# Patient Record
Sex: Male | Born: 1965 | Race: Black or African American | Hispanic: No | Marital: Married | Smoking: Never smoker
Health system: Southern US, Community
[De-identification: ages and names within clinical notes are randomized; demographics above are authoritative.]

## PROBLEM LIST (undated history)

## (undated) DIAGNOSIS — N419 Inflammatory disease of prostate, unspecified: Secondary | ICD-10-CM

## (undated) DIAGNOSIS — K921 Melena: Secondary | ICD-10-CM

## (undated) HISTORY — DX: Inflammatory disease of prostate, unspecified: N41.9

## (undated) HISTORY — PX: VASECTOMY: SHX75

## (undated) HISTORY — DX: Melena: K92.1

---

## 1992-11-06 HISTORY — PX: CERVICAL SPINE SURGERY: SHX589

## 2001-01-04 ENCOUNTER — Other Ambulatory Visit: Admission: RE | Admit: 2001-01-04 | Discharge: 2001-01-04 | Payer: Self-pay | Admitting: Urology

## 2001-09-26 ENCOUNTER — Encounter: Payer: Self-pay | Admitting: Emergency Medicine

## 2001-09-26 ENCOUNTER — Emergency Department (HOSPITAL_COMMUNITY): Admission: EM | Admit: 2001-09-26 | Discharge: 2001-09-26 | Payer: Self-pay | Admitting: Emergency Medicine

## 2001-11-08 ENCOUNTER — Encounter: Payer: Self-pay | Admitting: *Deleted

## 2001-11-08 ENCOUNTER — Ambulatory Visit (HOSPITAL_COMMUNITY): Admission: RE | Admit: 2001-11-08 | Discharge: 2001-11-08 | Payer: Self-pay | Admitting: *Deleted

## 2001-12-11 ENCOUNTER — Encounter: Payer: Self-pay | Admitting: Neurosurgery

## 2001-12-11 ENCOUNTER — Encounter: Admission: RE | Admit: 2001-12-11 | Discharge: 2001-12-11 | Payer: Self-pay | Admitting: Neurosurgery

## 2001-12-13 ENCOUNTER — Encounter: Admission: RE | Admit: 2001-12-13 | Discharge: 2001-12-13 | Payer: Self-pay | Admitting: Neurosurgery

## 2001-12-13 ENCOUNTER — Encounter: Payer: Self-pay | Admitting: Neurosurgery

## 2002-04-25 ENCOUNTER — Encounter: Admission: RE | Admit: 2002-04-25 | Discharge: 2002-07-24 | Payer: Self-pay

## 2002-08-18 ENCOUNTER — Encounter: Admission: RE | Admit: 2002-08-18 | Discharge: 2002-11-16 | Payer: Self-pay | Admitting: Anesthesiology

## 2002-11-27 ENCOUNTER — Encounter: Admission: RE | Admit: 2002-11-27 | Discharge: 2003-02-25 | Payer: Self-pay

## 2003-05-21 ENCOUNTER — Encounter
Admission: RE | Admit: 2003-05-21 | Discharge: 2003-08-19 | Payer: Self-pay | Admitting: Physical Medicine & Rehabilitation

## 2003-08-07 ENCOUNTER — Encounter: Payer: Self-pay | Admitting: Neurosurgery

## 2003-08-07 ENCOUNTER — Ambulatory Visit (HOSPITAL_COMMUNITY): Admission: RE | Admit: 2003-08-07 | Discharge: 2003-08-07 | Payer: Self-pay | Admitting: Neurosurgery

## 2003-09-30 ENCOUNTER — Ambulatory Visit (HOSPITAL_COMMUNITY): Admission: RE | Admit: 2003-09-30 | Discharge: 2003-10-01 | Payer: Self-pay | Admitting: Neurosurgery

## 2008-11-06 DIAGNOSIS — K921 Melena: Secondary | ICD-10-CM

## 2008-11-06 HISTORY — DX: Melena: K92.1

## 2010-01-03 LAB — CBC AND DIFFERENTIAL
HCT: 44 % (ref 41–53)
Hemoglobin: 14.8 g/dL (ref 13.5–17.5)
Neutrophils Absolute: 3 /uL
Platelets: 176 10*3/uL (ref 150–399)

## 2010-01-03 LAB — LIPID PANEL
Cholesterol: 164 mg/dL (ref 0–200)
HDL: 52 mg/dL (ref 35–70)
LDL Cholesterol: 102 mg/dL
Triglycerides: 51 mg/dL (ref 40–160)

## 2011-04-10 LAB — BASIC METABOLIC PANEL: Creatinine: 1.1 mg/dL (ref <=1.3)

## 2011-04-10 LAB — HEPATIC FUNCTION PANEL: Alkaline Phosphatase: 57 U/L (ref 25–125)

## 2011-06-12 ENCOUNTER — Encounter: Payer: Self-pay | Admitting: Gastroenterology

## 2012-11-04 ENCOUNTER — Encounter: Payer: Self-pay | Admitting: Physician Assistant

## 2012-11-04 ENCOUNTER — Ambulatory Visit (INDEPENDENT_AMBULATORY_CARE_PROVIDER_SITE_OTHER): Payer: BC Managed Care – PPO | Admitting: Physician Assistant

## 2012-11-04 VITALS — BP 100/67 | HR 58 | Temp 97.4°F | Resp 16 | Ht 67.5 in | Wt 134.0 lb

## 2012-11-04 DIAGNOSIS — Z0000 Encounter for general adult medical examination without abnormal findings: Secondary | ICD-10-CM

## 2012-11-04 LAB — TSH: TSH: 1.633 u[IU]/mL (ref 0.350–4.500)

## 2012-11-04 LAB — COMPREHENSIVE METABOLIC PANEL
ALT: 12 U/L (ref 0–53)
Albumin: 4.6 g/dL (ref 3.5–5.2)
CO2: 31 mEq/L (ref 19–32)
Calcium: 9.4 mg/dL (ref 8.4–10.5)
Chloride: 100 mEq/L (ref 96–112)
Glucose, Bld: 88 mg/dL (ref 70–99)
Sodium: 139 mEq/L (ref 135–145)
Total Protein: 6.8 g/dL (ref 6.0–8.3)

## 2012-11-04 LAB — POCT UA - MICROSCOPIC ONLY
Bacteria, U Microscopic: NEGATIVE
Casts, Ur, LPF, POC: NEGATIVE
Crystals, Ur, HPF, POC: NEGATIVE
Epithelial cells, urine per micros: NEGATIVE
Mucus, UA: NEGATIVE
RBC, urine, microscopic: NEGATIVE
WBC, Ur, HPF, POC: NEGATIVE
Yeast, UA: NEGATIVE

## 2012-11-04 LAB — LIPID PANEL
Cholesterol: 154 mg/dL (ref 0–200)
Triglycerides: 73 mg/dL (ref <150)

## 2012-11-04 LAB — CBC
Hemoglobin: 14.8 g/dL (ref 13.0–17.0)
MCV: 83.9 fL (ref 78.0–100.0)
Platelets: 179 10*3/uL (ref 150–400)
RBC: 4.97 MIL/uL (ref 4.22–5.81)
WBC: 4.6 10*3/uL (ref 4.0–10.5)

## 2012-11-04 LAB — POCT URINALYSIS DIPSTICK
Bilirubin, UA: NEGATIVE
Blood, UA: NEGATIVE
Glucose, UA: NEGATIVE
Ketones, UA: NEGATIVE
Leukocytes, UA: NEGATIVE
Nitrite, UA: NEGATIVE
pH, UA: 6

## 2012-11-04 NOTE — Progress Notes (Signed)
Patient ID: John Mercer MRN: [id number], DOB: 08-23-1966 46 y.o. Date of Encounter: 11/04/2012, 6:29 PM  Primary Physician: No primary provider on file.  Chief Complaint: Physical (CPE)  HPI: 46 y.o. y/o male with history noted below here for CPE. Doing well. No issues/complaints. Last CPE was 04/10/2011. Overall doing well. 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 Eats a healthy diet and gets a fair amount of exercise with his job. Has not received a flu vaccine this year and does not plan on receiving one.    Review of Systems: Consitutional: No fever, chills, fatigue, night sweats, lymphadenopathy, or weight changes. Eyes: No visual changes, eye redness, or discharge. ENT/Mouth: Ears: No otalgia, tinnitus, hearing loss, discharge. Nose: No congestion, rhinorrhea, sinus pain, or epistaxis. Throat: No sore throat, post nasal drip, or teeth pain. Cardiovascular: No CP, palpitations, diaphoresis, DOE, edema, orthopnea, PND. Respiratory: No cough, hemoptysis, SOB, or wheezing. Gastrointestinal: No anorexia, dysphagia, reflux, pain, nausea, vomiting, hematemesis, diarrhea, constipation, BRBPR, or melena. Genitourinary: No dysuria, frequency, urgency, hematuria, incontinence, nocturia, decreased urinary stream, discharge, impotence, or testicular pain/masses. Musculoskeletal: No decreased ROM, myalgias, stiffness, joint swelling, or weakness. Skin: No rash, erythema, lesion changes, pain, warmth, jaundice, or pruritis. Neurological: No headache, dizziness, syncope, seizures, tremors, memory loss, coordination problems, or paresthesias. Psychological: No anxiety, depression, hallucinations, SI/HI. Endocrine: No fatigue, polydipsia, polyphagia, polyuria, or known diabetes. All other systems were reviewed and are otherwise negative.  Past Medical History  Diagnosis Date  . Prostatitis   . Hematochezia 2010     Past  Surgical History  Procedure Date  . Vasectomy   . Cervical spine surgery 1994    Home Meds:  Prior to Admission medications   Not on File    Allergies: Not on File  History   Social History  . Marital Status: Married    Spouse Name: N/A    Number of Children: N/A  . Years of Education: N/A   Occupational History  . Not on file.   Social History Main Topics  . Smoking status: Never Smoker   . Smokeless tobacco: Never Used  . Alcohol Use: No  . Drug Use: No  . Sexually Active: Yes   Other Topics Concern  . Not on file   Social History Narrative  . No narrative on file    Family History  Problem Relation Age of Onset  . Hypertension Mother     Physical Exam: Blood pressure 100/67, pulse 58, temperature 97.4 F (36.3 C), temperature source Oral, resp. rate 16, height 5' 7.5" (1.715 m), weight 134 lb (60.782 kg).  General: Well developed, well nourished, in no acute distress. HEENT: Normocephalic, atraumatic. Conjunctiva pink, sclera non-icteric. Pupils 2 mm constricting to 1 mm, round, regular, and equally reactive to light and accomodation. EOMI. Internal auditory canal clear. TMs with good cone of light and without pathology. Nasal mucosa pink. Nares are without discharge. No sinus tenderness. Oral mucosa pink. Dentition normal. Pharynx without exudate.   Neck: Supple. Trachea midline. No thyromegaly. Full ROM. No lymphadenopathy. Lungs: Clear to auscultation bilaterally without wheezes, rales, or rhonchi. Breathing is of normal effort and unlabored. Cardiovascular: RRR with S1 S2. No murmurs, rubs, or gallops appreciated. Distal pulses 2+ symmetrically. No carotid or abdominal bruits. Abdomen: Soft, non-tender, non-distended with normoactive bowel sounds. No hepatosplenomegaly or masses. No rebound/guarding. No CVA tenderness. Without hernias.  Rectal: No external hemorrhoids or  fissures. Rectal vault without masses. Prostate normal range.   Genitourinary:  Circumcised male. No penile lesions. Testes descended bilaterally, and smooth without tenderness or masses.  Musculoskeletal: Full range of motion and 5/5 strength throughout. Without swelling, atrophy, tenderness, crepitus, or warmth. Extremities without clubbing, cyanosis, or edema. Calves supple. Skin: Warm and moist without erythema, ecchymosis, wounds, or rash. Neuro: A+Ox3. CN II-XII grossly intact. Moves all extremities spontaneously. Full sensation throughout. Normal gait. DTR 2+ throughout upper and lower extremities. Finger to nose intact. Psych:  Responds to questions appropriately with a normal affect.   Studies:  Results for orders placed in visit on 11/04/12  POCT UA - MICROSCOPIC ONLY      Component Value Range   WBC, Ur, HPF, POC neg     RBC, urine, microscopic neg     Bacteria, U Microscopic neg     Mucus, UA neg     Epithelial cells, urine per micros neg     Crystals, Ur, HPF, POC neg     Casts, Ur, LPF, POC neg     Yeast, UA neg    POCT URINALYSIS DIPSTICK      Component Value Range   Color, UA light yellow     Clarity, UA clear     Glucose, UA neg     Bilirubin, UA neg     Ketones, UA neg     Spec Grav, UA <=1.005     Blood, UA neg     pH, UA 6.0     Protein, UA neg     Urobilinogen, UA 0.2     Nitrite, UA neg     Leukocytes, UA Negative    IFOBT (OCCULT BLOOD)      Component Value Range   IFOBT Negative      Please see abstracted labs from 04/10/11 and 01/03/10. PSA from 04/10/11 is 0.95.   CBC, CMET, Lipid, PSA, TSH all pending. Patient is not fasting.   Assessment/Plan:  46 y.o. y/o [place] American male here for CPE -Healthy adult male physical -He declines influenza vaccine today, precautions given -Healthy diet -Exercise -Weight loss -He will need routine screening colonoscopy at age 46, this was discussed with him today, he understands and agrees -Anticipatory guidance  Signed, Eula Listen, PA-C 11/04/2012 6:29 PM

## 2012-12-25 ENCOUNTER — Other Ambulatory Visit: Payer: Self-pay | Admitting: Occupational Medicine

## 2012-12-25 ENCOUNTER — Ambulatory Visit: Payer: Self-pay

## 2012-12-25 DIAGNOSIS — R52 Pain, unspecified: Secondary | ICD-10-CM

## 2013-08-10 IMAGING — CR DG CLAVICLE*L*
2 series · 2 of 2 positions shown · non-contrast
Comparison: None.

CLINICAL DATA: Injured left clavicle with pain and bruising

LEFT CLAVICLE - 2+ VIEWS

[view not recorded (1 of 2)]
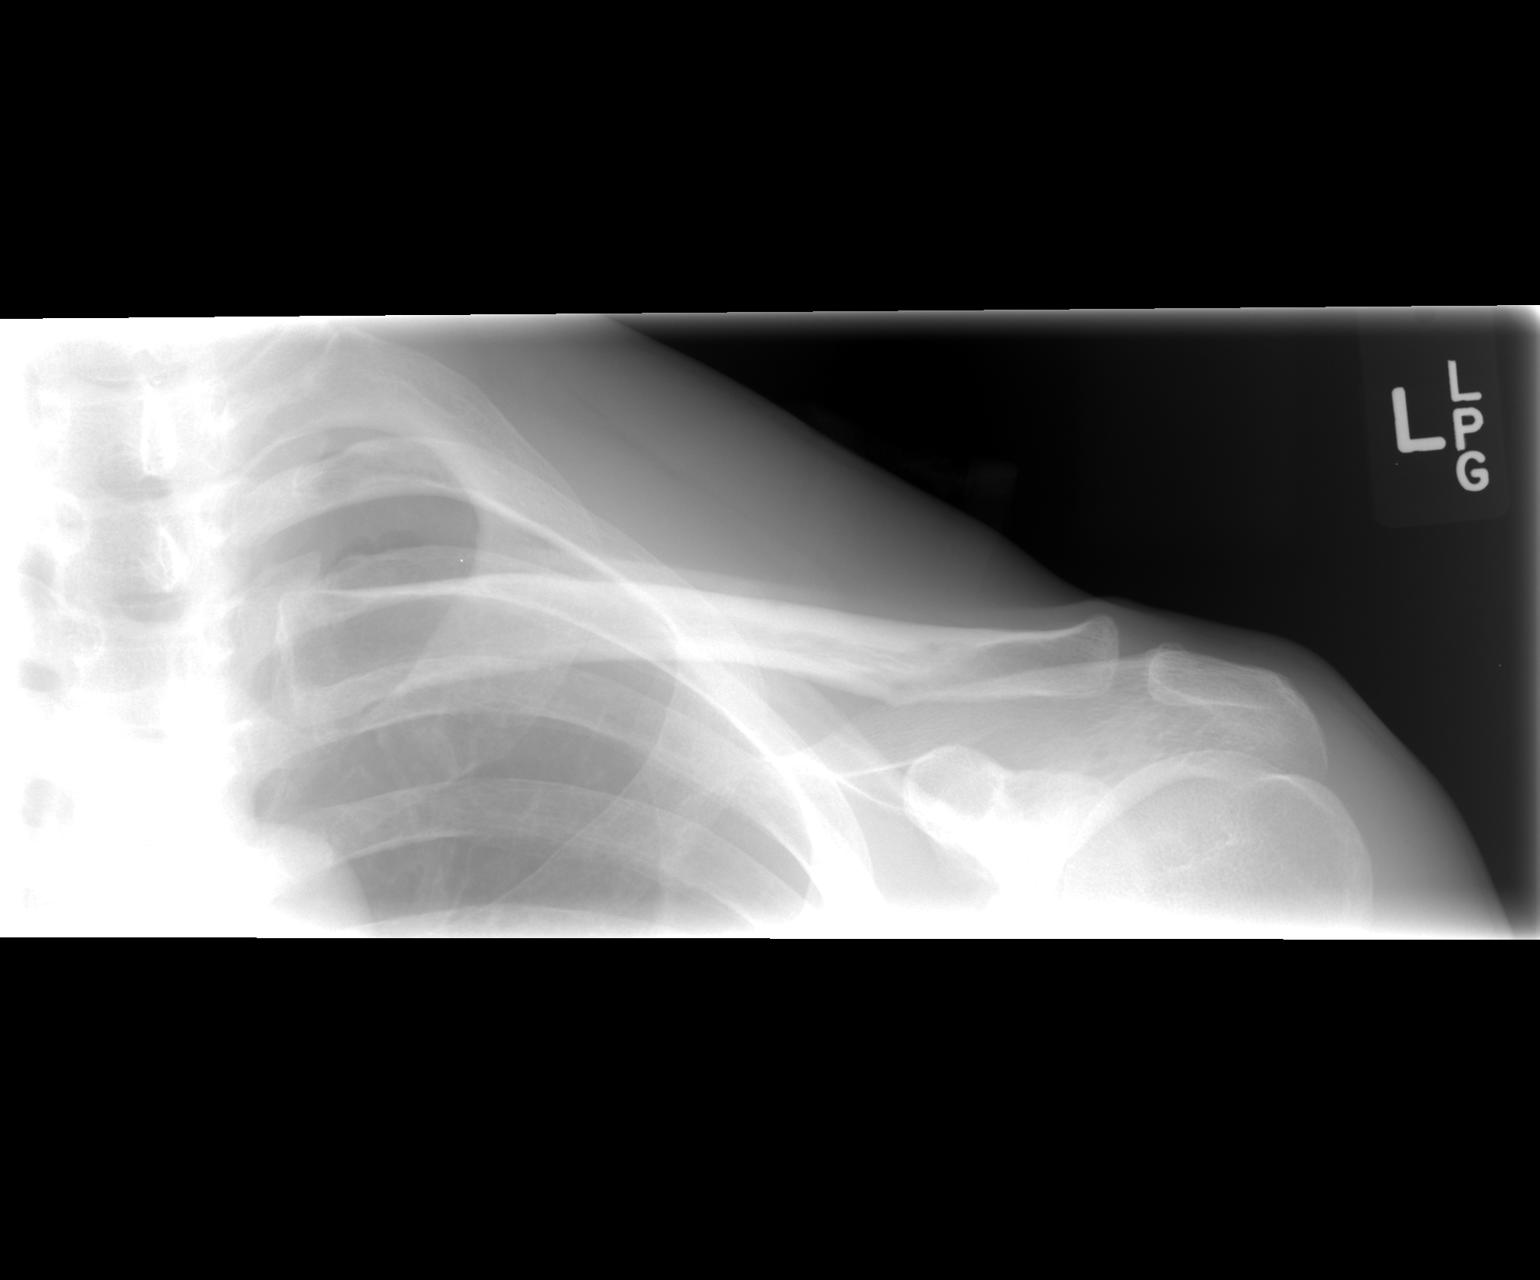

[view not recorded (2 of 2)]
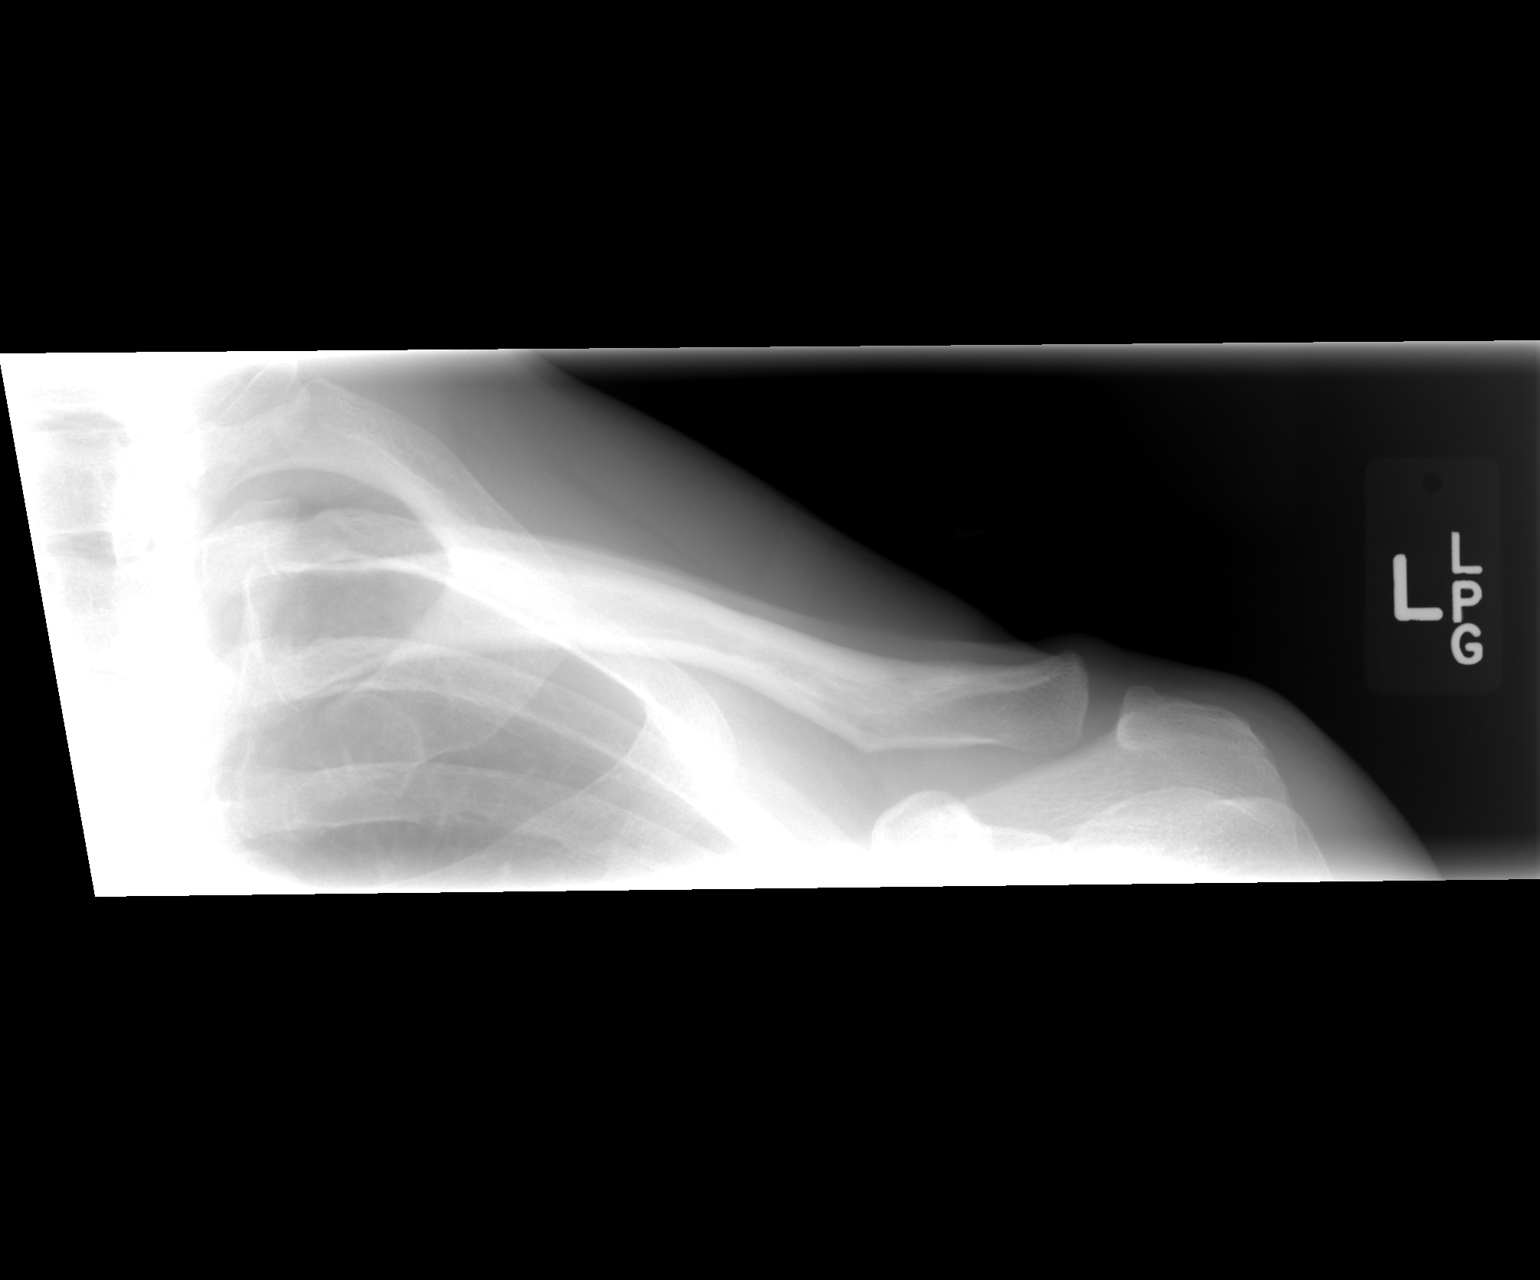

[2 of 2 positions shown; findings below may reference images not displayed]

FINDINGS: The left AC joint is normally aligned and the left
clavicle appears intact.  No fracture is seen.  The left humeral
head appears to be normally positioned.
IMPRESSION: Negative left clavicle.

## 2013-08-22 ENCOUNTER — Other Ambulatory Visit: Payer: Self-pay | Admitting: Physician Assistant

## 2013-08-27 ENCOUNTER — Other Ambulatory Visit: Payer: Self-pay

## 2013-08-27 NOTE — Telephone Encounter (Signed)
Ryan did the physical / please advise on the request for Valtrex, pended

## 2013-08-27 NOTE — Telephone Encounter (Signed)
Patient calling to get a refill on his rx valtrex. He was told by pharmacy to make an office visit.  Was seen last year for a cpe 2013- and is a patient of Dr. Perrin Maltese.  Says that it is too early for him to come in for his cpe and wants to know why we can not refill it? He requests to have it filled and says he will come in December for an office visit.   Pharmacy- [facility] off [address]- patient unsure which one-  please contact patient first.   [phone]

## 2013-09-02 NOTE — Telephone Encounter (Signed)
I don't see a Rx written in EPIC for this and no discussion at 10/2012 OV, HSV not on problem list. Alycia Rossetti, please advise.

## 2013-09-02 NOTE — Telephone Encounter (Signed)
PT STATES HE ISN'T DUE TO COME BACK IN UNTIL December AND WAS TOLD BY HIS PHARMACY THE MEDICINE WAS DENIED SINCE HE NEEDED AN OV PLEASE CALL PT AT [phone]   [facility] [address]

## 2013-09-05 MED ORDER — VALACYCLOVIR HCL 1 G PO TABS: 1000.0000 mg | ORAL_TABLET | Freq: Two times a day (BID) | ORAL | Status: DC

## 2013-09-05 NOTE — Telephone Encounter (Signed)
Discussed this RF w/Heather because it had still not been reviewed. For some reason it was not showing up on the Rx refill list on PA's computer, but is on mine. Heather reviewed and OKd refill through Dec. Notified pt done and apologized that the message was not reviewed sooner due to computer glitch. Pt voiced understanding and thanked [place].

## 2014-02-02 ENCOUNTER — Ambulatory Visit (INDEPENDENT_AMBULATORY_CARE_PROVIDER_SITE_OTHER): Payer: BC Managed Care – PPO | Admitting: Physician Assistant

## 2014-02-02 ENCOUNTER — Encounter: Payer: Self-pay | Admitting: Physician Assistant

## 2014-02-02 VITALS — BP 118/76 | HR 51 | Temp 97.5°F | Resp 16 | Ht 67.5 in | Wt 137.0 lb

## 2014-02-02 DIAGNOSIS — Z0000 Encounter for general adult medical examination without abnormal findings: Secondary | ICD-10-CM

## 2014-02-02 LAB — POCT URINALYSIS DIPSTICK
Bilirubin, UA: NEGATIVE
GLUCOSE UA: NEGATIVE
Ketones, UA: NEGATIVE
Leukocytes, UA: NEGATIVE
NITRITE UA: NEGATIVE
Protein, UA: NEGATIVE
RBC UA: NEGATIVE
Spec Grav, UA: <=1.005
UROBILINOGEN UA: 0.2
pH, UA: 7

## 2014-02-02 LAB — COMPLETE METABOLIC PANEL WITH GFR
ALK PHOS: 48 U/L (ref 39–117)
ALT: 11 U/L (ref 0–53)
AST: 18 U/L (ref 0–37)
Albumin: 4.5 g/dL (ref 3.5–5.2)
BILIRUBIN TOTAL: 0.9 mg/dL (ref 0.2–1.2)
BUN: 9 mg/dL (ref 6–23)
CALCIUM: 9.2 mg/dL (ref 8.4–10.5)
CHLORIDE: 100 meq/L (ref 96–112)
CO2: 31 mEq/L (ref 19–32)
CREATININE: 1.2 mg/dL (ref 0.50–1.35)
GFR, Est African American: 81 mL/min
GFR, Est Non African American: 70 mL/min
Glucose, Bld: 82 mg/dL (ref 70–99)
Potassium: 3.6 mEq/L (ref 3.5–5.3)
Sodium: 138 mEq/L (ref 135–145)
Total Protein: 7 g/dL (ref 6.0–8.3)

## 2014-02-02 LAB — POCT UA - MICROSCOPIC ONLY
CASTS, UR, LPF, POC: NEGATIVE
Crystals, Ur, HPF, POC: NEGATIVE
Mucus, UA: NEGATIVE
WBC, Ur, HPF, POC: NEGATIVE
YEAST UA: NEGATIVE

## 2014-02-02 LAB — CBC
HCT: 40.9 % (ref 39.0–52.0)
Hemoglobin: 14.4 g/dL (ref 13.0–17.0)
MCH: 29 pg (ref 26.0–34.0)
MCHC: 35.2 g/dL (ref 30.0–36.0)
MCV: 82.3 fL (ref 78.0–100.0)
PLATELETS: 192 10*3/uL (ref 150–400)
RBC: 4.97 MIL/uL (ref 4.22–5.81)
RDW: 14.3 % (ref 11.5–15.5)
WBC: 4.5 10*3/uL (ref 4.0–10.5)

## 2014-02-02 LAB — IFOBT (OCCULT BLOOD): IMMUNOLOGICAL FECAL OCCULT BLOOD TEST: NEGATIVE

## 2014-02-02 NOTE — Progress Notes (Signed)
Patient ID: John Mercer MRN: [id number], DOB: 1966/09/24 48 y.o. Date of Encounter: 02/02/2014, 9:01 PM  Primary Physician: No primary provider on file.  Chief Complaint: Physical (CPE)  HPI: 48 y.o. male with history noted below here for CPE. Doing well. No issues or complaints. Last physical was 11/04/12. Prior patient of Dr. Perrin Maltese, now establishing with me. Eats a healthy diet and gets a fair amount of exercise. Needs a colonoscopy. No alcohol, tobacco, or drug use.   Review of Systems: Consitutional: No fever, chills, fatigue, night sweats, lymphadenopathy, or weight changes. Eyes: No visual changes, eye redness, or discharge. ENT/Mouth: Ears: No otalgia, tinnitus, hearing loss, discharge. Nose: No congestion, rhinorrhea, sinus pain, or epistaxis. Throat: No sore throat, post nasal drip, or teeth pain. Cardiovascular: No CP, palpitations, diaphoresis, DOE, edema, orthopnea, PND. Respiratory: No cough, hemoptysis, SOB, or wheezing. Gastrointestinal: No anorexia, dysphagia, reflux, pain, nausea, vomiting, hematemesis, diarrhea, constipation, BRBPR, or melena. Genitourinary: No dysuria, frequency, urgency, hematuria, incontinence, nocturia, decreased urinary stream, discharge, impotence, or testicular pain/masses. Musculoskeletal: No decreased ROM, myalgias, stiffness, joint swelling, or weakness. Skin: No rash, erythema, lesion changes, pain, warmth, jaundice, or pruritis. Neurological: No headache, dizziness, syncope, seizures, tremors, memory loss, coordination problems, or paresthesias. Psychological: No anxiety, depression, hallucinations, SI/HI. Endocrine: No fatigue, polydipsia, polyphagia, polyuria, or known diabetes.   Past Medical History  Diagnosis Date  . Prostatitis   . Hematochezia 2010     Past Surgical History  Procedure Laterality Date  . Vasectomy    . Cervical spine surgery  1994    Home Meds:  Prior to Admission medications   Medication Sig Start Date  End Date Taking? Authorizing Provider  valACYclovir (VALTREX) 1000 MG tablet Take 1 tablet (1,000 mg total) by mouth 2 (two) times daily. 08/27/13  No Nelva Nay, PA-C    Allergies: No Known Allergies  History   Social History  . Marital Status: Married    Spouse Name: N/A    Number of Children: N/A  . Years of Education: N/A   Occupational History  . Not on file.   Social History Main Topics  . Smoking status: Never Smoker   . Smokeless tobacco: Never Used  . Alcohol Use: No  . Drug Use: No  . Sexual Activity: Yes   Other Topics Concern  . Not on file   Social History Narrative  . No narrative on file    Family History  Problem Relation Age of Onset  . Hypertension Mother     Physical Exam: Blood pressure 118/76, pulse 51, temperature 97.5 F (36.4 C), temperature source Oral, resp. rate 16, height 5' 7.5" (1.715 m), weight 137 lb (62.143 kg), SpO2 100.00%.  General: Well developed, well nourished, in no acute distress. HEENT: Normocephalic, atraumatic. Conjunctiva pink, sclera non-icteric. Pupils 2 mm constricting to 1 mm, round, regular, and equally reactive to light and accomodation. EOMI. Internal auditory canal clear. TMs with good cone of light and without pathology. Nasal mucosa pink. Nares are without discharge. No sinus tenderness. Oral mucosa pink. Dentition normal. Pharynx without exudate.   Neck: Supple. Trachea midline. No thyromegaly. Full ROM. No lymphadenopathy. Lungs: Clear to auscultation bilaterally without wheezes, rales, or rhonchi. Breathing is of normal effort and unlabored. Cardiovascular: RRR with S1 S2. No murmurs, rubs, or gallops appreciated. Distal pulses 2+ symmetrically. No carotid or abdominal bruits. Abdomen: Soft, non-tender, non-distended with normoactive bowel sounds. No hepatosplenomegaly or masses. No rebound/guarding. No CVA tenderness. Without hernias.  Rectal: No external  hemorrhoids or fissures. Rectal vault without masses.  Prostate not enlarged, smooth, symmetrical, without nodules, or TTP.  Genitourinary: Circumcised male. No penile lesions. Testes descended bilaterally, and smooth without tenderness or masses.  Musculoskeletal: Full range of motion and 5/5 strength throughout. Without swelling, atrophy, tenderness, crepitus, or warmth. Extremities without clubbing, cyanosis, or edema. Calves supple. Skin: Warm and moist without erythema, ecchymosis, wounds, or rash. Neuro: A+Ox3. CN II-XII grossly intact. Moves all extremities spontaneously. Full sensation throughout. Normal gait. DTR 2+ throughout upper and lower extremities. Finger to nose intact. Psych:  Responds to questions appropriately with a normal affect.   Studies:  Results for orders placed in visit on 02/02/14  POCT UA - MICROSCOPIC ONLY      Result Value Ref Range   WBC, Ur, HPF, POC neg     RBC, urine, microscopic 0-2     Bacteria, U Microscopic trace     Mucus, UA neg     Epithelial cells, urine per micros 0-1     Crystals, Ur, HPF, POC neg     Casts, Ur, LPF, POC neg     Yeast, UA neg    POCT URINALYSIS DIPSTICK      Result Value Ref Range   Color, UA yellow     Clarity, UA clear     Glucose, UA neg     Bilirubin, UA neg     Ketones, UA neg     Spec Grav, UA <=1.005     Blood, UA neg     pH, UA 7.0     Protein, UA neg     Urobilinogen, UA 0.2     Nitrite, UA neg     Leukocytes, UA Negative    IFOBT (OCCULT BLOOD)      Result Value Ref Range   IFOBT Negative       CBC, CMET, Lipid, PSA, TSH all pending. Patient is fasting.    Assessment/Plan:  48 y.o. male here for CPE  -Healthy adult male physical -Colonoscopy referral made -Await labs -Healthy diet and exercise -Age appropriate anticipatory guidance   Signed, Eula ListenRyan Abraham Margulies, PA-C [facility] [place] [id number] [phone] 02/02/2014 9:01 PM

## 2014-02-03 LAB — TSH: TSH: 1.103 u[IU]/mL (ref 0.350–4.500)

## 2014-02-03 LAB — PSA: PSA: 1.08 ng/mL (ref <=4.00)

## 2014-02-11 ENCOUNTER — Encounter: Payer: Self-pay | Admitting: Internal Medicine

## 2014-04-06 ENCOUNTER — Encounter: Payer: Self-pay | Admitting: Internal Medicine

## 2015-10-29 ENCOUNTER — Ambulatory Visit (INDEPENDENT_AMBULATORY_CARE_PROVIDER_SITE_OTHER): Payer: BLUE CROSS/BLUE SHIELD | Admitting: Physician Assistant

## 2015-10-29 VITALS — BP 124/76 | HR 66 | Temp 97.7°F | Resp 17 | Ht 68.5 in | Wt 135.0 lb

## 2015-10-29 DIAGNOSIS — Z1389 Encounter for screening for other disorder: Secondary | ICD-10-CM | POA: Diagnosis not present

## 2015-10-29 DIAGNOSIS — Z1211 Encounter for screening for malignant neoplasm of colon: Secondary | ICD-10-CM

## 2015-10-29 DIAGNOSIS — Z130 Encounter for screening for diseases of the blood and blood-forming organs and certain disorders involving the immune mechanism: Secondary | ICD-10-CM | POA: Diagnosis not present

## 2015-10-29 DIAGNOSIS — Z0000 Encounter for general adult medical examination without abnormal findings: Secondary | ICD-10-CM | POA: Diagnosis not present

## 2015-10-29 DIAGNOSIS — Z13220 Encounter for screening for lipoid disorders: Secondary | ICD-10-CM

## 2015-10-29 DIAGNOSIS — Z23 Encounter for immunization: Secondary | ICD-10-CM | POA: Diagnosis not present

## 2015-10-29 NOTE — Patient Instructions (Signed)
Your exam was normal today. Keep up the good work! We are checking labs today and will let you know those results.  You received the tdap vaccine today. We referred you for a colonoscopy today. They will be in touch with you to get that scheduled.   Health Maintenance, Male A healthy lifestyle and preventative care can promote health and wellness.  Maintain regular health, dental, and eye exams.  Eat a healthy diet. Foods like vegetables, fruits, whole grains, low-fat dairy products, and lean protein foods contain the nutrients you need and are low in calories. Decrease your intake of foods high in solid fats, added sugars, and salt. Get information about a proper diet from your health care provider, if necessary.  Regular physical exercise is one of the most important things you can do for your health. Most adults should get at least 150 minutes of moderate-intensity exercise (any activity that increases your heart rate and causes you to sweat) each week. In addition, most adults need muscle-strengthening exercises on 2 or more days a week.   Maintain a healthy weight. The body mass index (BMI) is a screening tool to identify possible weight problems. It provides an estimate of body fat based on height and weight. Your health care provider can find your BMI and can help you achieve or maintain a healthy weight. For males 20 years and older:  A BMI below 18.5 is considered underweight.  A BMI of 18.5 to 24.9 is normal.  A BMI of 25 to 29.9 is considered overweight.  A BMI of 30 and above is considered obese.  Maintain normal blood lipids and cholesterol by exercising and minimizing your intake of saturated fat. Eat a balanced diet with plenty of fruits and vegetables. Blood tests for lipids and cholesterol should begin at age 51 and be repeated every 5 years. If your lipid or cholesterol levels are high, you are over age 51, or you are at high risk for heart disease, you may need your  cholesterol levels checked more frequently.Ongoing high lipid and cholesterol levels should be treated with medicines if diet and exercise are not working.  If you smoke, find out from your health care provider how to quit. If you do not use tobacco, do not start.  Lung cancer screening is recommended for adults aged 55-80 years who are at high risk for developing lung cancer because of a history of smoking. A yearly low-dose CT scan of the lungs is recommended for people who have at least a 30-pack-year history of smoking and are current smokers or have quit within the past 15 years. A pack year of smoking is smoking an average of 1 pack of cigarettes a day for 1 year (for example, a 30-pack-year history of smoking could mean smoking 1 pack a day for 30 years or 2 packs a day for 15 years). Yearly screening should continue until the smoker has stopped smoking for at least 15 years. Yearly screening should be stopped for people who develop a health problem that would prevent them from having lung cancer treatment.  If you choose to drink alcohol, do not have more than 2 drinks per day. One drink is considered to be 12 oz (360 mL) of beer, 5 oz (150 mL) of wine, or 1.5 oz (45 mL) of liquor.  Avoid the use of street drugs. Do not share needles with anyone. Ask for help if you need support or instructions about stopping the use of drugs.  High blood  pressure causes heart disease and increases the risk of stroke. High blood pressure is more likely to develop in:  People who have blood pressure in the end of the normal range (100-139/85-89 mm Hg).  People who are overweight or obese.  People who are African American.  If you are 74-9 years of age, have your blood pressure checked every 3-5 years. If you are 34 years of age or older, have your blood pressure checked every year. You should have your blood pressure measured twice--once when you are at a hospital or clinic, and once when you are not at a  hospital or clinic. Record the average of the two measurements. To check your blood pressure when you are not at a hospital or clinic, you can use:  An automated blood pressure machine at a pharmacy.  A home blood pressure monitor.  If you are 59-30 years old, ask your health care provider if you should take aspirin to prevent heart disease.  Diabetes screening involves taking a blood sample to check your fasting blood sugar level. This should be done once every 3 years after age 39 if you are at a normal weight and without risk factors for diabetes. Testing should be considered at a younger age or be carried out more frequently if you are overweight and have at least 1 risk factor for diabetes.  Colorectal cancer can be detected and often prevented. Most routine colorectal cancer screening begins at the age of 28 and continues through age 36. However, your health care provider may recommend screening at an earlier age if you have risk factors for colon cancer. On a yearly basis, your health care provider may provide home test kits to check for hidden blood in the stool. A small camera at the end of a tube may be used to directly examine the colon (sigmoidoscopy or colonoscopy) to detect the earliest forms of colorectal cancer. Talk to your health care provider about this at age 25 when routine screening begins. A direct exam of the colon should be repeated every 5-10 years through age 70, unless early forms of precancerous polyps or small growths are found.  People who are at an increased risk for hepatitis B should be screened for this virus. You are considered at high risk for hepatitis B if:  You were born in a country where hepatitis B occurs often. Talk with your health care provider about which countries are considered high risk.  Your parents were born in a high-risk country and you have not received a shot to protect against hepatitis B (hepatitis B vaccine).  You have HIV or AIDS.  You  use needles to inject street drugs.  You live with, or have sex with, someone who has hepatitis B.  You are a man who has sex with other men (MSM).  You get hemodialysis treatment.  You take certain medicines for conditions like cancer, organ transplantation, and autoimmune conditions.  Hepatitis C blood testing is recommended for all people born from [birth year] through 1967 and any individual with known risk factors for hepatitis C.  Healthy men should no longer receive prostate-specific antigen (PSA) blood tests as part of routine cancer screening. Talk to your health care provider about prostate cancer screening.  Testicular cancer screening is not recommended for adolescents or adult males who have no symptoms. Screening includes self-exam, a health care provider exam, and other screening tests. Consult with your health care provider about any symptoms you have or any concerns you  have about testicular cancer.  Practice safe sex. Use condoms and avoid high-risk sexual practices to reduce the spread of sexually transmitted infections (STIs).  You should be screened for STIs, including gonorrhea and chlamydia if:  You are sexually active and are younger than 24 years.  You are older than 24 years, and your health care provider tells you that you are at risk for this type of infection.  Your sexual activity has changed since you were last screened, and you are at an increased risk for chlamydia or gonorrhea. Ask your health care provider if you are at risk.  If you are at risk of being infected with HIV, it is recommended that you take a prescription medicine daily to prevent HIV infection. This is called pre-exposure prophylaxis (PrEP). You are considered at risk if:  You are a man who has sex with other men (MSM).  You are a heterosexual man who is sexually active with multiple partners.  You take drugs by injection.  You are sexually active with a partner who has HIV.  Talk with  your health care provider about whether you are at high risk of being infected with HIV. If you choose to begin PrEP, you should first be tested for HIV. You should then be tested every 3 months for as long as you are taking PrEP.  Use sunscreen. Apply sunscreen liberally and repeatedly throughout the day. You should seek shade when your shadow is shorter than you. Protect yourself by wearing long sleeves, pants, a wide-brimmed hat, and sunglasses year round whenever you are outdoors.  Tell your health care provider of new moles or changes in moles, especially if there is a change in shape or color. Also, tell your health care provider if a mole is larger than the size of a pencil eraser.  A one-time screening for abdominal aortic aneurysm (AAA) and surgical repair of large AAAs by ultrasound is recommended for men aged 30-75 years who are current or former smokers.  Stay current with your vaccines (immunizations).   This information is not intended to replace advice given to you by your health care provider. Make sure you discuss any questions you have with your health care provider.   Document Released: 04/20/2008 Document Revised: 11/13/2014 Document Reviewed: 03/20/2011 Elsevier Interactive Patient Education [employer].

## 2015-10-29 NOTE — Progress Notes (Signed)
   Subjective:    Patient ID: John Mercer, male    DOB: 05-Jan-1966, 49 y.o.   MRN: [id number]  Chief Complaint  Patient presents with  . Annual Exam   Medications, allergies, past medical history, surgical history, family history, social history and problem list reviewed and updated.  HPI  49 yo healthy male presents for complete physical.  Last here for cpe 1.5 years ago. Labwork normal at that time.   Denies any new issues, complaints since last visit with us. Still working same job, enjoying it. He does not formally exercise but walks a few days per week and is very active with work and around the house. Does not follow any specific diet.   Does not drink alcohol, use tobacco, or illicit drugs. He has never had a colonoscopy. Unsure when his last tdap was. Not concerned about STDs.   Review of Systems Negative per ROS sheet    Objective:   Physical Exam  Constitutional: He is oriented to person, place, and time. He appears well-developed and well-nourished.  Non-toxic appearance. He does not have a sickly appearance. He does not appear ill. No distress.  BP 124/76 mmHg  Pulse 66  Temp(Src) 97.7 F (36.5 C) (Oral)  Resp 17  Ht 5' 8.5" (1.74 m)  Wt 135 lb (61.236 kg)  BMI 20.23 kg/m2  SpO2 97%   HENT:  Right Ear: Tympanic membrane normal.  Left Ear: Tympanic membrane normal.  Mouth/Throat: Uvula is midline, oropharynx is clear and moist and mucous membranes are normal.  Eyes: Conjunctivae and EOM are normal. Pupils are equal, round, and reactive to light.  Neck: Normal range of motion. No JVD present. Carotid bruit is not present. No thyroid mass and no thyromegaly present.  Cardiovascular: Normal rate, regular rhythm and normal heart sounds.   Pulmonary/Chest: Effort normal and breath sounds normal. No tachypnea.  Abdominal: Soft. Normal appearance and bowel sounds are normal. There is no hepatosplenomegaly. There is no tenderness.  Neurological: He is alert and  oriented to person, place, and time. He has normal strength. No cranial nerve deficit or sensory deficit. He displays a negative Romberg sign.  Skin: No rash noted.  Psychiatric: He has a normal mood and affect. His speech is normal and behavior is normal.      Assessment & Plan:   Annual physical exam  Screen for colon cancer - Plan: Ambulatory referral to Gastroenterology  Screening for deficiency anemia - Plan: CBC  Screening for nephropathy - Plan: COMPLETE METABOLIC PANEL WITH GFR  Screening for hyperlipidemia - Plan: Lipid panel  Need for Tdap vaccination - Plan: Tdap vaccine greater than or equal to 7yo IM --normal vitals, exam, review of systems, history --labwork today, will f/u --refer for colonoscopy --tdap today --encouraged formal exercise routine  Donnajean Lopesodd M. Aristides Luckey, PA-C Physician Assistant-Certified [facility]  10/29/2015 9:53 PM

## 2015-10-30 LAB — COMPLETE METABOLIC PANEL WITH GFR
ALBUMIN: 4.5 g/dL (ref 3.6–5.1)
ALK PHOS: 53 U/L (ref 40–115)
ALT: 10 U/L (ref 9–46)
AST: 15 U/L (ref 10–35)
BILIRUBIN TOTAL: 0.5 mg/dL (ref 0.2–1.2)
BUN: 7 mg/dL (ref 7–25)
CO2: 31 mmol/L (ref 20–31)
Calcium: 9.2 mg/dL (ref 8.6–10.3)
Chloride: 102 mmol/L (ref 98–110)
Creat: 1.12 mg/dL (ref 0.70–1.33)
GFR, EST AFRICAN AMERICAN: 87 mL/min (ref >=60)
GFR, EST NON AFRICAN AMERICAN: 76 mL/min (ref >=60)
GLUCOSE: 95 mg/dL (ref 65–99)
Potassium: 4.3 mmol/L (ref 3.5–5.3)
Sodium: 141 mmol/L (ref 135–146)
TOTAL PROTEIN: 7.5 g/dL (ref 6.1–8.1)

## 2015-10-30 LAB — LIPID PANEL
Cholesterol: 146 mg/dL (ref 125–200)
HDL: 49 mg/dL (ref >=40)
LDL Cholesterol: 84 mg/dL (ref <130)
TRIGLYCERIDES: 63 mg/dL (ref <150)
Total CHOL/HDL Ratio: 3 Ratio (ref <=5.0)
VLDL: 13 mg/dL (ref <30)

## 2015-10-30 LAB — CBC
HEMATOCRIT: 41.8 % (ref 39.0–52.0)
HEMOGLOBIN: 14.3 g/dL (ref 13.0–17.0)
MCH: 30 pg (ref 26.0–34.0)
MCHC: 34.2 g/dL (ref 30.0–36.0)
MCV: 87.8 fL (ref 78.0–100.0)
MPV: 9.6 fL (ref 8.6–12.4)
Platelets: 191 10*3/uL (ref 150–400)
RBC: 4.76 MIL/uL (ref 4.22–5.81)
RDW: 13.4 % (ref 11.5–15.5)
WBC: 5.7 10*3/uL (ref 4.0–10.5)

## 2015-11-04 NOTE — Progress Notes (Signed)
  Medical screening examination/treatment/procedure(s) were performed by non-physician practitioner and as supervising physician I was immediately available for consultation/collaboration.     

## 2015-11-04 NOTE — Addendum Note (Signed)
Addended by: Carmelina DaneANDERSON, JEFFERY S on: 11/04/2015 05:27 PM   Modules accepted: Kipp BroodSmartSet

## 2017-07-24 DIAGNOSIS — Z23 Encounter for immunization: Secondary | ICD-10-CM | POA: Diagnosis not present

## 2017-08-29 ENCOUNTER — Ambulatory Visit (INDEPENDENT_AMBULATORY_CARE_PROVIDER_SITE_OTHER): Payer: BLUE CROSS/BLUE SHIELD | Admitting: Physician Assistant

## 2017-08-29 ENCOUNTER — Encounter: Payer: Self-pay | Admitting: Physician Assistant

## 2017-08-29 ENCOUNTER — Other Ambulatory Visit: Payer: Self-pay | Admitting: Physician Assistant

## 2017-08-29 VITALS — BP 118/76 | HR 73 | Temp 97.7°F | Resp 16 | Ht 68.11 in | Wt 139.4 lb

## 2017-08-29 DIAGNOSIS — Z13228 Encounter for screening for other metabolic disorders: Secondary | ICD-10-CM

## 2017-08-29 DIAGNOSIS — Z1329 Encounter for screening for other suspected endocrine disorder: Secondary | ICD-10-CM

## 2017-08-29 DIAGNOSIS — Z0000 Encounter for general adult medical examination without abnormal findings: Secondary | ICD-10-CM

## 2017-08-29 DIAGNOSIS — Z1211 Encounter for screening for malignant neoplasm of colon: Secondary | ICD-10-CM

## 2017-08-29 DIAGNOSIS — Z130 Encounter for screening for diseases of the blood and blood-forming organs and certain disorders involving the immune mechanism: Secondary | ICD-10-CM | POA: Diagnosis not present

## 2017-08-29 DIAGNOSIS — Z1321 Encounter for screening for nutritional disorder: Secondary | ICD-10-CM | POA: Diagnosis not present

## 2017-08-29 DIAGNOSIS — B009 Herpesviral infection, unspecified: Secondary | ICD-10-CM | POA: Insufficient documentation

## 2017-08-29 DIAGNOSIS — Z23 Encounter for immunization: Secondary | ICD-10-CM | POA: Diagnosis not present

## 2017-08-29 MED ORDER — VALACYCLOVIR HCL 1 G PO TABS: 1000.0000 mg | ORAL_TABLET | Freq: Two times a day (BID) | ORAL | 1 refills | Status: DC

## 2017-08-29 MED ORDER — ZOSTER VAC RECOMB ADJUVANTED 50 MCG/0.5ML IM SUSR: 0.5000 mL | Freq: Once | INTRAMUSCULAR | 1 refills | Status: AC

## 2017-08-29 NOTE — Assessment & Plan Note (Signed)
Three to four outbreaks yearly.  Takes 1 gram valtrex bid for three days and symptoms resolve.

## 2017-08-29 NOTE — Patient Instructions (Signed)
     IF you received an x-ray today, you will receive an invoice from [facility]. Please contact [facility] at [phone] with questions or concerns regarding your invoice.   IF you received labwork today, you will receive an invoice from [facility]. Please contact [facility] at [phone] with questions or concerns regarding your invoice.   Our billing staff will not be able to assist you with questions regarding bills from these companies.  You will be contacted with the lab results as soon as they are available. The fastest way to get your results is to activate your My Chart account. Instructions are located on the last page of this paperwork. If you have not heard from us regarding the results in 2 weeks, please contact this office.     

## 2017-08-29 NOTE — Progress Notes (Signed)
08/29/2017 8:40 AM   DOB: 06/11/1966 / MRN: [id number]  SUBJECTIVE:  John Mercer is a 51 y.o. male presenting for annual exam. He has not had a colonoscopy and would like this ordered today.    The natural history of prostate cancer and ongoing controversy regarding screening and potential treatment outcomes of prostate cancer has been discussed with the patient. The meaning of a false positive PSA and a false negative PSA has been discussed. He indicates understanding of the limitations of this screening test and wishes not to proceed with screening PSA testing. Has a non bloody bowel movement about Mercer day.    Immunization History  Administered Date(s) Administered  . Tdap 10/29/2015     He has No Known Allergies.   He  has a past medical history of Hematochezia (2010) and Prostatitis.    He  reports that he has never smoked. He has never used smokeless tobacco. He reports that he does not drink alcohol or use drugs. He  reports that he currently engages in sexual activity. The patient  has a past surgical history that includes Vasectomy and Cervical spine surgery (1994).  His family history includes Hypertension in his mother.  Review of Systems  Constitutional: Negative for chills, diaphoresis and fever.  Eyes: Negative.   Respiratory: Negative for cough, hemoptysis, sputum production, shortness of breath and wheezing.   Cardiovascular: Negative for chest pain, orthopnea and leg swelling.  Gastrointestinal: Negative for abdominal pain, blood in stool, constipation, diarrhea, heartburn, melena, nausea and vomiting.  Genitourinary: Negative for flank pain.  Skin: Negative for rash.  Neurological: Negative for dizziness, sensory change, speech change, focal weakness and headaches.    The problem list and medications were reviewed and updated by myself where necessary and exist elsewhere in the encounter.   OBJECTIVE:  BP 118/76 (BP Location: Right Arm, Patient  Position: Sitting, Cuff Size: Normal)   Pulse 73   Temp 97.7 F (36.5 C) (Oral)   Resp 16   Ht 5' 8.11" (1.73 m)   Wt 139 lb 6.4 oz (63.2 kg)   SpO2 99%   BMI 21.13 kg/m   Physical Exam  Constitutional: He is oriented to person, place, and time. He appears well-developed. He is active and cooperative.  Non-toxic appearance.  Eyes: Pupils are equal, round, and reactive to light. EOM are normal.  Cardiovascular: Normal rate, regular rhythm, S1 normal, S2 normal, normal heart sounds, intact distal pulses and normal pulses.  Exam reveals no gallop and no friction rub.   No murmur heard. Pulmonary/Chest: Effort normal. No stridor. No tachypnea. No respiratory distress. He has no wheezes. He has no rales.  Abdominal: Soft. Normal appearance and bowel sounds are normal. He exhibits no distension and no mass. There is no tenderness. There is no rigidity, no rebound, no guarding and no CVA tenderness. No hernia.  Musculoskeletal: He exhibits no edema.  Neurological: He is alert and oriented to person, place, and time. He has normal strength and normal reflexes. He is not disoriented. No cranial nerve deficit or sensory deficit. He exhibits normal muscle tone. Coordination and gait normal.  Skin: Skin is warm and dry. He is not diaphoretic. No pallor.  Psychiatric: His behavior is normal.  Vitals reviewed.   No results found for this or any previous visit (from the past 72 hour(s)).  No results found.  ASSESSMENT AND PLAN:  John Mercer was seen today for annual exam.  Diagnoses and all orders for this visit:  Annual physical exam  Screening for endocrine, nutritional, metabolic and immunity disorder -     CBC -     Lipid panel -     TSH -     Hemoglobin A1c -     CMP and Liver  Special screening for malignant neoplasms, colon -     Ambulatory referral to Gastroenterology    The patient is advised to call or return to clinic if he does not see an improvement in symptoms, or to seek  the care of the closest emergency department if he worsens with the above plan.   Deliah BostonMichael Clark, MHS, PA-C [facility] 08/29/2017 8:40 AM

## 2017-08-30 LAB — CBC
HEMATOCRIT: 41.1 % (ref 37.5–51.0)
HEMOGLOBIN: 13.8 g/dL (ref 13.0–17.7)
MCH: 29.7 pg (ref 26.6–33.0)
MCHC: 33.6 g/dL (ref 31.5–35.7)
MCV: 88 fL (ref 79–97)
Platelets: 182 10*3/uL (ref 150–379)
RBC: 4.65 x10E6/uL (ref 4.14–5.80)
RDW: 14.1 % (ref 12.3–15.4)
WBC: 4.1 10*3/uL (ref 3.4–10.8)

## 2017-08-30 LAB — CMP AND LIVER
ALBUMIN: 4.5 g/dL (ref 3.5–5.5)
ALT: 11 IU/L (ref 0–44)
AST: 14 IU/L (ref 0–40)
Alkaline Phosphatase: 57 IU/L (ref 39–117)
BILIRUBIN TOTAL: 0.4 mg/dL (ref 0.0–1.2)
BILIRUBIN, DIRECT: 0.12 mg/dL (ref 0.00–0.40)
BUN: 7 mg/dL (ref 6–24)
CALCIUM: 9.1 mg/dL (ref 8.7–10.2)
CHLORIDE: 99 mmol/L (ref 96–106)
CO2: 26 mmol/L (ref 20–29)
Creatinine, Ser: 1.18 mg/dL (ref 0.76–1.27)
GFR calc non Af Amer: 70 mL/min/{1.73_m2} (ref >59)
GFR, EST AFRICAN AMERICAN: 81 mL/min/{1.73_m2} (ref >59)
Glucose: 85 mg/dL (ref 65–99)
POTASSIUM: 4.1 mmol/L (ref 3.5–5.2)
SODIUM: 140 mmol/L (ref 134–144)
TOTAL PROTEIN: 6.8 g/dL (ref 6.0–8.5)

## 2017-08-30 LAB — LIPID PANEL
CHOL/HDL RATIO: 3.5 ratio (ref 0.0–5.0)
CHOLESTEROL TOTAL: 158 mg/dL (ref 100–199)
HDL: 45 mg/dL (ref >39)
LDL CALC: 104 mg/dL — AB (ref 0–99)
Triglycerides: 43 mg/dL (ref 0–149)
VLDL Cholesterol Cal: 9 mg/dL (ref 5–40)

## 2017-08-30 LAB — TSH: TSH: 1.67 u[IU]/mL (ref 0.450–4.500)

## 2017-08-30 LAB — HEMOGLOBIN A1C
Est. average glucose Bld gHb Est-mCnc: 97 mg/dL
HEMOGLOBIN A1C: 5 % (ref 4.8–5.6)

## 2017-08-31 NOTE — Progress Notes (Signed)
Please make patient aware of results via letter. In the context of his overall presentation any abnormal values are of no clinical significance.  John BostonMichael Mercer Inman PA-C, 08/31/2017 11:05 AM

## 2017-09-01 ENCOUNTER — Encounter: Payer: Self-pay | Admitting: Radiology

## 2017-09-11 ENCOUNTER — Encounter: Payer: Self-pay | Admitting: Gastroenterology

## 2017-10-26 ENCOUNTER — Ambulatory Visit (AMBULATORY_SURGERY_CENTER): Payer: Self-pay

## 2017-10-26 ENCOUNTER — Other Ambulatory Visit: Payer: Self-pay

## 2017-10-26 VITALS — Ht 68.0 in | Wt 142.2 lb

## 2017-10-26 DIAGNOSIS — Z1211 Encounter for screening for malignant neoplasm of colon: Secondary | ICD-10-CM

## 2017-10-26 MED ORDER — PLENVU 140 G PO SOLR: 1.0000 | Freq: Once | ORAL | 0 refills | Status: AC

## 2017-10-26 NOTE — Progress Notes (Signed)
Denies allergies to eggs or soy products. Denies complication of anesthesia or sedation. Denies use of weight loss medication. Denies use of O2.   Emmi instructions declined.  

## 2017-11-05 ENCOUNTER — Telehealth: Payer: Self-pay | Admitting: Physician Assistant

## 2017-11-09 ENCOUNTER — Encounter: Payer: Self-pay | Admitting: Gastroenterology

## 2017-11-09 ENCOUNTER — Other Ambulatory Visit: Payer: Self-pay

## 2017-11-09 ENCOUNTER — Ambulatory Visit (AMBULATORY_SURGERY_CENTER): Payer: BLUE CROSS/BLUE SHIELD | Admitting: Gastroenterology

## 2017-11-09 VITALS — BP 115/70 | HR 72 | Temp 96.4°F | Resp 11 | Ht 68.0 in | Wt 142.0 lb

## 2017-11-09 DIAGNOSIS — Z1212 Encounter for screening for malignant neoplasm of rectum: Secondary | ICD-10-CM | POA: Diagnosis not present

## 2017-11-09 DIAGNOSIS — Z1211 Encounter for screening for malignant neoplasm of colon: Secondary | ICD-10-CM

## 2017-11-09 MED ORDER — SODIUM CHLORIDE 0.9 % IV SOLN: 500.0000 mL | Freq: Once | INTRAVENOUS | Status: AC

## 2017-11-09 NOTE — Patient Instructions (Signed)
YOU HAD AN ENDOSCOPIC PROCEDURE TODAY AT THE [facility]:   Refer to the procedure report that was given to you for any specific questions about what was found during the examination.  If the procedure report does not answer your questions, please call your gastroenterologist to clarify.  If you requested that your care partner not be given the details of your procedure findings, then the procedure report has been included in a sealed envelope for you to review at your convenience later.  YOU SHOULD EXPECT: Some feelings of bloating in the abdomen. Passage of more gas than usual.  Walking can help get rid of the air that was put into your GI tract during the procedure and reduce the bloating. If you had a lower endoscopy (such as a colonoscopy or flexible sigmoidoscopy) you may notice spotting of blood in your stool or on the toilet paper. If you underwent a bowel prep for your procedure, you may not have a normal bowel movement for a few days.  Please Note:  You might notice some irritation and congestion in your nose or some drainage.  This is from the oxygen used during your procedure.  There is no need for concern and it should clear up in a day or so.  SYMPTOMS TO REPORT IMMEDIATELY:   Following lower endoscopy (colonoscopy or flexible sigmoidoscopy):  Excessive amounts of blood in the stool  Significant tenderness or worsening of abdominal pains  Swelling of the abdomen that is new, acute  Fever of 100F or higher  For urgent or emergent issues, a gastroenterologist can be reached at any hour by calling [phone].   DIET:  We do recommend a small meal at first, but then you may proceed to your regular diet.  Drink plenty of fluids but you should avoid alcoholic beverages for 24 hours.  ACTIVITY:  You should plan to take it easy for the rest of today and you should NOT DRIVE or use heavy machinery until tomorrow (because of the sedation medicines used during the test).     FOLLOW UP: Our staff will call the number listed on your records the next business day following your procedure to check on you and address any questions or concerns that you may have regarding the information given to you following your procedure. If we do not reach you, we will leave a message.  However, if you are feeling well and you are not experiencing any problems, there is no need to return our call.  We will assume that you have returned to your regular daily activities without incident.  If any biopsies were taken you will be contacted by phone or by letter within the next 1-3 weeks.  Please call us at [phone] if you have not heard about the biopsies in 3 weeks.    SIGNATURES/CONFIDENTIALITY: You and/or your care partner have signed paperwork which will be entered into your electronic medical record.  These signatures attest to the fact that that the information above on your After Visit Summary has been reviewed and is understood.  Full responsibility of the confidentiality of this discharge information lies with you and/or your care-partner.    Handouts were given to your care partner on  diverticulosis,and a high fiber diet with liberal fluid intake. You may resume your current medications today. Repeat next colonoscopy in 10 years. Please call if any questions or concerns.

## 2017-11-09 NOTE — Progress Notes (Signed)
Pt's states no medical or surgical changes since previsit or office visit. 

## 2017-11-09 NOTE — Progress Notes (Signed)
No problems noted in the recovery room. maw 

## 2017-11-09 NOTE — Op Note (Signed)
[facility] Patient Name: John Mercer Procedure Date: 11/09/2017 11:22 AM MRN: [id number] Endoscopist: Meryl Dare , MD Age: 52 Referring MD:  Date of Birth: 1966-02-17 Gender: Male Account #: [id number] Procedure:                Colonoscopy Indications:              Screening for colorectal malignant neoplasm Medicines:                Monitored Anesthesia Care Procedure:                Pre-Anesthesia Assessment:                           - Prior to the procedure, a History and Physical                            was performed, and patient medications and                            allergies were reviewed. The patient's tolerance of                            previous anesthesia was also reviewed. The risks                            and benefits of the procedure and the sedation                            options and risks were discussed with the patient.                            All questions were answered, and informed consent                            was obtained. Prior Anticoagulants: The patient has                            taken no previous anticoagulant or antiplatelet                            agents. ASA Grade Assessment: II - A patient with                            mild systemic disease. After reviewing the risks                            and benefits, the patient was deemed in                            satisfactory condition to undergo the procedure.                           After obtaining informed consent, the colonoscope  was passed under direct vision. Throughout the                            procedure, the patient's blood pressure, pulse, and                            oxygen saturations were monitored continuously. The                            Model PCF-[id number]) scope was introduced                            through the anus and advanced to the the cecum,                            identified by  appendiceal orifice and ileocecal                            valve. The ileocecal valve, appendiceal orifice,                            and rectum were photographed. The quality of the                            bowel preparation was excellent. The colonoscopy                            was performed without difficulty. The patient                            tolerated the procedure well. Scope In: 11:27:41 AM Scope Out: 11:42:59 AM Scope Withdrawal Time: 0 hours 9 minutes 42 seconds  Total Procedure Duration: 0 hours 15 minutes 18 seconds  Findings:                 The perianal and digital rectal examinations were                            normal.                           Scattered small-mouthed diverticula were found in                            the right colon. There was no evidence of                            diverticular bleeding.                           The exam was otherwise without abnormality on                            direct and retroflexion views. Complications:            No immediate complications. Estimated blood loss:  None. Estimated Blood Loss:     Estimated blood loss: none. Impression:               - Mild diverticulosis in the right colon. There was                            no evidence of diverticular bleeding.                           - The examination was otherwise normal on direct                            and retroflexion views.                           - No specimens collected. Recommendation:           - Repeat colonoscopy in 10 years for screening                            purposes.                           - Patient has a contact number available for                            emergencies. The signs and symptoms of potential                            delayed complications were discussed with the                            patient. Return to normal activities tomorrow.                            Written discharge  instructions were provided to the                            patient.                           - Resume previous diet.                           - Continue present medications. Meryl DareMalcolm T Nery Frappier, MD 11/09/2017 11:45:03 AM This report has been signed electronically.

## 2017-11-09 NOTE — Progress Notes (Signed)
Report to PACU, RN, vss, BBS= Clear.  

## 2017-11-12 ENCOUNTER — Telehealth: Payer: Self-pay

## 2017-11-12 NOTE — Telephone Encounter (Signed)
  Follow up Call-  Call back number 11/09/2017  Post procedure Call Back phone  # [phone]  Permission to leave phone message Yes  Some recent data might be hidden     Patient questions:  Do you have a fever, pain , or abdominal swelling? No. Pain Score  0 *  Have you tolerated food without any problems? Yes.    Have you been able to return to your normal activities? Yes.    Do you have any questions about your discharge instructions: Diet   No. Medications  No. Follow up visit  No.  Do you have questions or concerns about your Care? No.  Actions: * If pain score is 4 or above: No action needed, pain <4.

## 2017-11-12 NOTE — Telephone Encounter (Signed)
Left message

## 2018-10-31 ENCOUNTER — Other Ambulatory Visit: Payer: Self-pay | Admitting: Physician Assistant

## 2018-11-01 NOTE — Telephone Encounter (Signed)
Called and spoke with pt regarding RX refill. I advised that the med was refilled but that he would need to make an appt to establish care with a new provider. I was able to make an appt with Dr., Alvy BimlerSagardia on 11/29/18 at 8:40. I advised of time, building number and late policy. Pt acknowledged.

## 2018-11-29 ENCOUNTER — Other Ambulatory Visit: Payer: Self-pay

## 2018-11-29 ENCOUNTER — Ambulatory Visit (INDEPENDENT_AMBULATORY_CARE_PROVIDER_SITE_OTHER): Payer: BLUE CROSS/BLUE SHIELD | Admitting: Emergency Medicine

## 2018-11-29 ENCOUNTER — Encounter: Payer: Self-pay | Admitting: Emergency Medicine

## 2018-11-29 VITALS — BP 125/85 | HR 61 | Temp 97.6°F | Ht 68.0 in | Wt 143.8 lb

## 2018-11-29 DIAGNOSIS — Z13228 Encounter for screening for other metabolic disorders: Secondary | ICD-10-CM

## 2018-11-29 DIAGNOSIS — Z23 Encounter for immunization: Secondary | ICD-10-CM | POA: Diagnosis not present

## 2018-11-29 DIAGNOSIS — Z0000 Encounter for general adult medical examination without abnormal findings: Secondary | ICD-10-CM | POA: Diagnosis not present

## 2018-11-29 DIAGNOSIS — Z1159 Encounter for screening for other viral diseases: Secondary | ICD-10-CM

## 2018-11-29 DIAGNOSIS — Z13220 Encounter for screening for lipoid disorders: Secondary | ICD-10-CM | POA: Diagnosis not present

## 2018-11-29 DIAGNOSIS — Z130 Encounter for screening for diseases of the blood and blood-forming organs and certain disorders involving the immune mechanism: Secondary | ICD-10-CM

## 2018-11-29 DIAGNOSIS — Z1329 Encounter for screening for other suspected endocrine disorder: Secondary | ICD-10-CM

## 2018-11-29 DIAGNOSIS — Z114 Encounter for screening for human immunodeficiency virus [HIV]: Secondary | ICD-10-CM

## 2018-11-29 LAB — POCT URINALYSIS DIP (MANUAL ENTRY)
BILIRUBIN UA: NEGATIVE
Blood, UA: NEGATIVE
GLUCOSE UA: NEGATIVE mg/dL
Ketones, POC UA: NEGATIVE mg/dL
Leukocytes, UA: NEGATIVE
NITRITE UA: NEGATIVE
Protein Ur, POC: NEGATIVE mg/dL
SPEC GRAV UA: 1.02 (ref 1.010–1.025)
UROBILINOGEN UA: 0.2 U/dL
pH, UA: 5.5 (ref 5.0–8.0)

## 2018-11-29 NOTE — Patient Instructions (Addendum)

## 2018-11-29 NOTE — Progress Notes (Signed)
John Mercer 53 y.o.   Chief Complaint  Patient presents with  . Annual Exam    CPE    HISTORY OF PRESENT ILLNESS: This is a 54 y.o. male here for his annual exam.  No chronic medical problems.  On no chronic medications.  Non-smoker no EtOH user.  Physically active.  Has no complaints or medical concerns today. Colonoscopy done 1 year ago was normal. No significant family history.  HPI   Prior to Admission medications   Not on File    No Known Allergies  Patient Active Problem List   Diagnosis Date Noted  . 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 Hematochezia 2010  . Prostatitis     Past Surgical History:  Procedure Laterality Date  . [place]  . VASECTOMY      Social History   Socioeconomic History  . Marital status: Married    Spouse name: Not on file  . Number of children: Not on file  . Years of education: Not on file  . Highest education level: Not on file  Occupational History  . Not on file  Social Needs  . Financial resource strain: Not on file  . Food insecurity:    Worry: Not on file    Inability: Not on file  . Transportation needs:    Medical: Not on file    Non-medical: Not on file  Tobacco Use  . Smoking status: Never Smoker  . Smokeless tobacco: Never Used  Substance and Sexual Activity  . Alcohol use: No  . Drug use: No  . Sexual activity: Yes  Lifestyle  . Physical activity:    Days per week: Not on file    Minutes per session: Not on file  . Stress: Not on file  Relationships  . Social connections:    Talks on phone: Not on file    Gets together: Not on file    Attends religious service: Not on file    Active member of club or organization: Not on file    Attends meetings of clubs or organizations: Not on file    Relationship status: Not on file  . Intimate partner violence:    Fear of current or ex partner: Not on file    Emotionally abused: Not on file   Physically abused: Not on file    Forced sexual activity: Not on file  Other Topics Concern  . Not on file  Social History Narrative  . Not on file    Family History  Problem Relation Age of Onset  . Hypertension Mother   . Colon cancer Neg Hx   . Esophageal cancer Neg Hx   . Pancreatic cancer Neg Hx   . Rectal cancer Neg Hx   . Stomach cancer Neg Hx      Review of Systems  Constitutional: Negative.  Negative for chills, fever and weight loss.  HENT: Negative.  Negative for hearing loss and sore throat.   Eyes: Negative.  Negative for blurred vision and double vision.  Respiratory: Negative.  Negative for cough, hemoptysis and shortness of breath.   Cardiovascular: Negative.  Negative for chest pain and palpitations.  Gastrointestinal: Negative.  Negative for abdominal pain, blood in stool, diarrhea, nausea and vomiting.  Genitourinary: Negative.  Negative for dysuria and hematuria.  Musculoskeletal: Negative.   Skin: Negative.  Negative for rash.  Neurological: Negative.  Negative for dizziness and headaches.  Endo/Heme/Allergies: Negative.  All other systems reviewed and are negative.   Vitals:   11/29/18 0828  BP: 125/85  Pulse: 61  Temp: 97.6 F (36.4 C)  SpO2: 100%    Physical Exam Vitals signs reviewed.  Constitutional:      Appearance: Normal appearance.  HENT:     Head: Normocephalic and atraumatic.     Nose: Nose normal.     Mouth/Throat:     Mouth: Mucous membranes are moist.     Pharynx: Oropharynx is clear.  Eyes:     Extraocular Movements: Extraocular movements intact.     Conjunctiva/sclera: Conjunctivae normal.     Pupils: Pupils are equal, round, and reactive to light.  Neck:     Musculoskeletal: Normal range of motion and neck supple.  Cardiovascular:     Rate and Rhythm: Normal rate and regular rhythm.     Heart sounds: Normal heart sounds.  Pulmonary:     Effort: Pulmonary effort is normal.     Breath sounds: Normal breath sounds.    Abdominal:     General: There is no distension.     Palpations: Abdomen is soft. There is no mass.     Tenderness: There is no abdominal tenderness.  Musculoskeletal: Normal range of motion.  Lymphadenopathy:     Cervical: No cervical adenopathy.  Skin:    General: Skin is warm and dry.     Capillary Refill: Capillary refill takes less than 2 seconds.  Neurological:     General: No focal deficit present.     Mental Status: He is alert and oriented to person, place, and time.     Sensory: No sensory deficit.     Motor: No weakness.  Psychiatric:        Mood and Affect: Mood normal.        Behavior: Behavior normal.      ASSESSMENT & PLAN: Torin was seen today for annual exam.  Diagnoses and all orders for this visit:  Routine general medical examination at a health care facility  Annual physical exam -     POCT urinalysis dipstick -     Lipid panel -     CMP14+EGFR  Flu vaccine need -     Flu Vaccine QUAD 36+ mos IM  Screening for lipoid disorders -     Lipid panel  Screening for endocrine, metabolic and immunity disorder -     CMP14+EGFR  Screening for HIV (human immunodeficiency virus) -     HIV Antibody (routine testing w rflx)  Need for hepatitis C screening test -     Hepatitis C antibody    Patient Instructions       If you have lab work done today you will be contacted with your lab results within the next 2 weeks.  If you have not heard from [place] then please contact us. The fastest way to get your results is to register for My Chart.   IF you received an x-ray today, you will receive an invoice from [facility]. Please contact [facility] at [phone] with questions or concerns regarding your invoice.   IF you received labwork today, you will receive an invoice from [facility]. Please contact [facility] at [phone] with questions or concerns regarding your invoice.   Our billing staff will not be able to assist you with  questions regarding bills from these companies.  You will be contacted with the lab results as soon as they are available. The fastest way to get your results  is to activate your My Chart account. Instructions are located on the last page of this paperwork. If you have not heard from [place] regarding the results in 2 weeks, please contact this office.       Health Maintenance, Male A healthy lifestyle and preventive care is important for your health and wellness. Ask your health care provider about what schedule of regular examinations is right for you. What should I know about weight and diet? Eat a Healthy Diet  Eat plenty of vegetables, fruits, whole grains, low-fat dairy products, and lean protein.  Do not eat a lot of foods high in solid fats, added sugars, or salt.  Maintain a Healthy Weight Regular exercise can help you achieve or maintain a healthy weight. You should:  Do at least 150 minutes of exercise each week. The exercise should increase your heart rate and make you sweat (moderate-intensity exercise).  Do strength-training exercises at least twice a week. Watch Your Levels of Cholesterol and Blood Lipids  Have your blood tested for lipids and cholesterol every 5 years starting at 55 years of age. If you are at high risk for heart disease, you should start having your blood tested when you are 55 years old. You may need to have your cholesterol levels checked more often if: ? Your lipid or cholesterol levels are high. ? You are older than 55 years of age. ? You are at high risk for heart disease. What should I know about cancer screening? Many types of cancers can be detected early and may often be prevented. Lung Cancer  You should be screened every year for lung cancer if: ? You are a current smoker who has smoked for at least 30 years. ? You are a former smoker who has quit within the past 15 years.  Talk to your health care provider about your screening options,  when you should start screening, and how often you should be screened. Colorectal Cancer  Routine colorectal cancer screening usually begins at 55 years of age and should be repeated every 5-10 years until you are 55 years old. You may need to be screened more often if early forms of precancerous polyps or small growths are found. Your health care provider may recommend screening at an earlier age if you have risk factors for colon cancer.  Your health care provider may recommend using home test kits to check for hidden blood in the stool.  A small camera at the end of a tube can be used to examine your colon (sigmoidoscopy or colonoscopy). This checks for the earliest forms of colorectal cancer. Prostate and Testicular Cancer  Depending on your age and overall health, your health care provider may do certain tests to screen for prostate and testicular cancer.  Talk to your health care provider about any symptoms or concerns you have about testicular or prostate cancer. Skin Cancer  Check your skin from head to toe regularly.  Tell your health care provider about any new moles or changes in moles, especially if: ? There is a change in a mole's size, shape, or color. ? You have a mole that is larger than a pencil eraser.  Always use sunscreen. Apply sunscreen liberally and repeat throughout the day.  Protect yourself by wearing long sleeves, pants, a wide-brimmed hat, and sunglasses when outside. What should I know about heart disease, diabetes, and high blood pressure?  If you are 45-79 years of age, have your blood pressure checked every 3-5 years. If  you are 83 years of age or older, have your blood pressure checked every year. You should have your blood pressure measured twice-once when you are at a hospital or clinic, and once when you are not at a hospital or clinic. Record the average of the two measurements. To check your blood pressure when you are not at a hospital or clinic, you  can use: ? An automated blood pressure machine at a pharmacy. ? A home blood pressure monitor.  Talk to your health care provider about your target blood pressure.  If you are between 18-66 years old, ask your health care provider if you should take aspirin to prevent heart disease.  Have regular diabetes screenings by checking your fasting blood sugar level. ? If you are at a normal weight and have a low risk for diabetes, have this test once every three years after the age of 65. ? If you are overweight and have a high risk for diabetes, consider being tested at a younger age or more often.  A one-time screening for abdominal aortic aneurysm (AAA) by ultrasound is recommended for men aged 38-75 years who are current or former smokers. What should I know about preventing infection? Hepatitis B If you have a higher risk for hepatitis B, you should be screened for this virus. Talk with your health care provider to find out if you are at risk for hepatitis B infection. Hepatitis C Blood testing is recommended for:  Everyone born from [birth year] through 1967.  Anyone with known risk factors for hepatitis C. Sexually Transmitted Diseases (STDs)  You should be screened each year for STDs including gonorrhea and chlamydia if: ? You are sexually active and are younger than 55 years of age. ? You are older than 55 years of age and your health care provider tells you that you are at risk for this type of infection. ? Your sexual activity has changed since you were last screened and you are at an increased risk for chlamydia or gonorrhea. Ask your health care provider if you are at risk.  Talk with your health care provider about whether you are at high risk of being infected with HIV. Your health care provider may recommend a prescription medicine to help prevent HIV infection. What else can I do?  Schedule regular health, dental, and eye exams.  Stay current with your vaccines  (immunizations).  Do not use any tobacco products, such as cigarettes, chewing tobacco, and e-cigarettes. If you need help quitting, ask your health care provider.  Limit alcohol intake to no more than 2 drinks per day. One drink equals 12 ounces of beer, 5 ounces of wine, or 1 ounces of hard liquor.  Do not use street drugs.  Do not share needles.  Ask your health care provider for help if you need support or information about quitting drugs.  Tell your health care provider if you often feel depressed.  Tell your health care provider if you have ever been abused or do not feel safe at home. This information is not intended to replace advice given to you by your health care provider. Make sure you discuss any questions you have with your health care provider. Document Released: 04/20/2008 Document Revised: 06/21/2016 Document Reviewed: 07/27/2015 Elsevier Interactive Patient Education  2019 Elsevier Inc.      Agustina Caroli, MD [facility]

## 2018-11-30 LAB — CMP14+EGFR
A/G RATIO: 2.2 (ref 1.2–2.2)
ALK PHOS: 53 IU/L (ref 39–117)
ALT: 14 IU/L (ref 0–44)
AST: 14 IU/L (ref 0–40)
Albumin: 4.4 g/dL (ref 3.8–4.9)
BUN/Creatinine Ratio: 9 (ref 9–20)
BUN: 11 mg/dL (ref 6–24)
Bilirubin Total: 0.4 mg/dL (ref 0.0–1.2)
CO2: 24 mmol/L (ref 20–29)
Calcium: 9.3 mg/dL (ref 8.7–10.2)
Chloride: 101 mmol/L (ref 96–106)
Creatinine, Ser: 1.25 mg/dL (ref 0.76–1.27)
GFR calc Af Amer: 75 mL/min/{1.73_m2} (ref >59)
GFR calc non Af Amer: 65 mL/min/{1.73_m2} (ref >59)
GLOBULIN, TOTAL: 2 g/dL (ref 1.5–4.5)
Glucose: 87 mg/dL (ref 65–99)
POTASSIUM: 4 mmol/L (ref 3.5–5.2)
SODIUM: 139 mmol/L (ref 134–144)
Total Protein: 6.4 g/dL (ref 6.0–8.5)

## 2018-11-30 LAB — LIPID PANEL
CHOLESTEROL TOTAL: 139 mg/dL (ref 100–199)
Chol/HDL Ratio: 3.4 ratio (ref 0.0–5.0)
HDL: 41 mg/dL (ref >39)
LDL CALC: 88 mg/dL (ref 0–99)
TRIGLYCERIDES: 48 mg/dL (ref 0–149)
VLDL CHOLESTEROL CAL: 10 mg/dL (ref 5–40)

## 2018-11-30 LAB — HIV ANTIBODY (ROUTINE TESTING W REFLEX): HIV SCREEN 4TH GENERATION: NONREACTIVE

## 2018-11-30 LAB — HEPATITIS C ANTIBODY: Hep C Virus Ab: <0.1 s/co ratio (ref 0.0–0.9)

## 2018-12-02 ENCOUNTER — Encounter: Payer: Self-pay | Admitting: *Deleted

## 2019-02-07 ENCOUNTER — Telehealth: Payer: Self-pay | Admitting: Emergency Medicine

## 2019-02-07 NOTE — Telephone Encounter (Signed)
Needs a tele med apt or can take some OTC.

## 2019-02-07 NOTE — Telephone Encounter (Signed)
Copied from CRM [id number]. Topic: Quick Communication - Rx Refill/Question >> Feb 07, 2019 12:28 PM John Mercer, John Mercer wrote: Medication: zyrtec   Has the patient contacted their pharmacy? Yes. Patient states his "flex pay" will only work if doctor puts in a prescription for him. Last seen by Dr.Sagardia 11/2018.  Preferred Pharmacy (with phone number or street name):  [id number] - [place], [place] - [address] [phone] (Phone) [phone] (Fax)  Agent: Please be advised that RX refills may take up to 3 business days. We ask that you follow-up with your pharmacy.

## 2019-03-14 ENCOUNTER — Other Ambulatory Visit: Payer: Self-pay | Admitting: Physician Assistant

## 2019-07-30 ENCOUNTER — Encounter: Payer: Self-pay | Admitting: Emergency Medicine

## 2019-07-30 ENCOUNTER — Other Ambulatory Visit: Payer: Self-pay

## 2019-07-30 ENCOUNTER — Telehealth (INDEPENDENT_AMBULATORY_CARE_PROVIDER_SITE_OTHER): Payer: BC Managed Care – PPO | Admitting: Emergency Medicine

## 2019-07-30 DIAGNOSIS — B009 Herpesviral infection, unspecified: Secondary | ICD-10-CM | POA: Diagnosis not present

## 2019-07-30 MED ORDER — VALACYCLOVIR HCL 1 G PO TABS: 1000.0000 mg | ORAL_TABLET | Freq: Two times a day (BID) | ORAL | 3 refills | Status: AC

## 2019-07-30 NOTE — Progress Notes (Signed)
Pt is asking for a refill on valtrex, asking for a 90 day supply per insurance. No other medical concerns at this time.

## 2019-07-30 NOTE — Progress Notes (Signed)
Telemedicine Encounter- SOAP NOTE Established Patient  This telephone encounter was conducted with the patient's (or proxy's) verbal consent via audio telecommunications: yes/no: Yes Patient was instructed to have this encounter in a suitably private space; and to only have persons present to whom they give permission to participate. In addition, patient identity was confirmed by use of name plus two identifiers (DOB and address).  I discussed the limitations, risks, security and privacy concerns of performing an evaluation and management service by telephone and the availability of in person appointments. I also discussed with the patient that there may be a patient responsible charge related to this service. The patient expressed understanding and agreed to proceed.  I spent a total of TIME; 0 MIN TO 60 MIN: 15 minutes talking with the patient or their proxy.  No chief complaint on file. HSV infection/medication refill  Subjective   John Mercer is a 53 y.o. male established patient. Telephone visit today complaining of recurrent HSV infections, needs medication refill.  No other complaints or medical concerns today.  HPI   Patient Active Problem List   Diagnosis Date Noted  . 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 Hematochezia 2010  . Prostatitis     Current Outpatient Medications  Medication Sig Dispense Refill  . valACYclovir (VALTREX) 1000 MG tablet Take 1 tablet (1,000 mg total) by mouth 2 (two) times daily. 180 tablet 3   Current Facility-Administered Medications  Medication Dose Route Frequency Provider Last Rate Last Dose  . 0.9 %  sodium chloride infusion  500 mL Intravenous Once Meryl Dare, MD        No Known Allergies  Social History   Socioeconomic History  . Marital status: Married    Spouse name: Not on file  . Number of children: Not on file  . Years of education: Not on file  . Highest education level: Not on  file  Occupational History  . Not on file  Social Needs  . Financial resource strain: Not on file  . Food insecurity    Worry: Not on file    Inability: Not on file  . Transportation needs    Medical: Not on file    Non-medical: Not on file  Tobacco Use  . Smoking status: Never Smoker  . Smokeless tobacco: Never Used  Substance and Sexual Activity  . Alcohol use: No  . Drug use: No  . Sexual activity: Yes  Lifestyle  . Physical activity    Days per week: Not on file    Minutes per session: Not on file  . Stress: Not on file  Relationships  . Social Musician on phone: Not on file    Gets together: Not on file    Attends religious service: Not on file    Active member of club or organization: Not on file    Attends meetings of clubs or organizations: Not on file    Relationship status: Not on file  . Intimate partner violence    Fear of current or ex partner: Not on file    Emotionally abused: Not on file    Physically abused: Not on file    Forced sexual activity: Not on file  Other Topics Concern  . Not on file  Social History Narrative  . Not on file    Review of Systems  Constitutional: Negative.  Negative for chills and fever.  HENT: Negative.  Negative for congestion and sore throat.   Respiratory: Negative.  Negative for cough and shortness of breath.   Cardiovascular: Negative.  Negative for chest pain and palpitations.  Gastrointestinal: Negative.  Negative for abdominal pain, diarrhea, nausea and vomiting.  Musculoskeletal: Negative.  Negative for myalgias.  Skin: Negative.  Negative for rash.  Neurological: Negative for dizziness and headaches.  All other systems reviewed and are negative.   Objective  Alert and oriented x3 in no apparent respiratory distress. Vitals as reported by the patient: There were no vitals filed for this visit.  Diagnoses and all orders for this visit:  HSV infection -     valACYclovir (VALTREX) 1000 MG  tablet; Take 1 tablet (1,000 mg total) by mouth 2 (two) times daily.     I discussed the assessment and treatment plan with the patient. The patient was provided an opportunity to ask questions and all were answered. The patient agreed with the plan and demonstrated an understanding of the instructions.   The patient was advised to call back or seek an in-person evaluation if the symptoms worsen or if the condition fails to improve as anticipated.  I provided 15 minutes of non-face-to-face time during this encounter.  Horald Pollen, MD  Primary Care at [facility] [place] [place]

## 2019-10-25 DIAGNOSIS — Z03818 Encounter for observation for suspected exposure to other biological agents ruled out: Secondary | ICD-10-CM | POA: Diagnosis not present

## 2020-01-10 ENCOUNTER — Ambulatory Visit: Payer: BLUE CROSS/BLUE SHIELD | Attending: Internal Medicine

## 2020-01-10 DIAGNOSIS — Z23 Encounter for immunization: Secondary | ICD-10-CM | POA: Insufficient documentation

## 2020-01-10 NOTE — Progress Notes (Signed)
   Covid-19 Vaccination Clinic  Name:  John Mercer    MRN: [id number] DOB: 1966-01-16  01/10/2020  [name] was observed post Covid-19 immunization for 15 minutes without incident. He was provided with Vaccine Information Sheet and instruction to access the V-Safe system.   [name] was instructed to call 911 with any severe reactions post vaccine: Marland Kitchen Difficulty breathing  . Swelling of face and throat  . A fast heartbeat  . A bad rash all over body  . Dizziness and weakness   Immunizations Administered    Name Date Dose VIS Date Route   Pfizer COVID-19 Vaccine 01/10/2020 12:41 PM 0.3 mL 10/17/2019 Intramuscular   Manufacturer: [employer], [employer]   Lot: [id number]   NDC: [address]-6

## 2020-01-31 ENCOUNTER — Ambulatory Visit: Payer: Self-pay | Attending: Internal Medicine

## 2020-01-31 DIAGNOSIS — Z23 Encounter for immunization: Secondary | ICD-10-CM

## 2020-01-31 NOTE — Progress Notes (Signed)
   Covid-19 Vaccination Clinic  Name:  John Mercer    MRN: [id number] DOB: 20-Mar-1966  01/31/2020  John Mercer was observed post Covid-19 immunization for 15 minutes without incident. He was provided with Vaccine Information Sheet and instruction to access the V-Safe system.   John Mercer was instructed to call 911 with any severe reactions post vaccine: Marland Kitchen Difficulty breathing  . Swelling of face and throat  . A fast heartbeat  . A bad rash all over body  . Dizziness and weakness   Immunizations Administered    Name Date Dose VIS Date Route   Pfizer COVID-19 Vaccine 01/31/2020 10:57 AM 0.3 mL 10/17/2019 Intramuscular   Manufacturer: [employer], [employer]   Lot: [id number]   NDC: [address]-0

## 2020-12-22 ENCOUNTER — Encounter: Payer: Self-pay | Admitting: Emergency Medicine

## 2020-12-22 ENCOUNTER — Ambulatory Visit (INDEPENDENT_AMBULATORY_CARE_PROVIDER_SITE_OTHER): Payer: Managed Care, Other (non HMO) | Admitting: Emergency Medicine

## 2020-12-22 ENCOUNTER — Other Ambulatory Visit: Payer: Self-pay

## 2020-12-22 VITALS — BP 138/80 | HR 70 | Temp 97.9°F | Ht 68.0 in | Wt 144.8 lb

## 2020-12-22 DIAGNOSIS — Z0001 Encounter for general adult medical examination with abnormal findings: Secondary | ICD-10-CM | POA: Diagnosis not present

## 2020-12-22 DIAGNOSIS — Z1329 Encounter for screening for other suspected endocrine disorder: Secondary | ICD-10-CM

## 2020-12-22 DIAGNOSIS — Z0000 Encounter for general adult medical examination without abnormal findings: Secondary | ICD-10-CM

## 2020-12-22 DIAGNOSIS — Z23 Encounter for immunization: Secondary | ICD-10-CM | POA: Diagnosis not present

## 2020-12-22 DIAGNOSIS — Z13228 Encounter for screening for other metabolic disorders: Secondary | ICD-10-CM

## 2020-12-22 DIAGNOSIS — Z130 Encounter for screening for diseases of the blood and blood-forming organs and certain disorders involving the immune mechanism: Secondary | ICD-10-CM

## 2020-12-22 DIAGNOSIS — Z13220 Encounter for screening for lipoid disorders: Secondary | ICD-10-CM

## 2020-12-22 NOTE — Patient Instructions (Signed)

## 2020-12-22 NOTE — Progress Notes (Signed)
John Mercer 55 y.o.   Chief Complaint  Patient presents with  . Annual Exam    With no other problem at this time    HISTORY OF PRESENT ILLNESS: This is a 55 y.o. male here for annual exam. Non-smoker.  No EtOH user. Up-to-date with colonoscopy. Fully vaccinated against Covid. No chronic medical problems.  No chronic medications. Has no complaints or medical concerns today. Healthy male with a healthy lifestyle.  HPI   Prior to Admission medications   Not on File    No Known Allergies  Patient Active Problem List   Diagnosis Date Noted  . 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 Hematochezia 2010  . Prostatitis     Past Surgical History:  Procedure Laterality Date  . CERVICAL SPINE SURGERY  1994  . VASECTOMY      Social History   Socioeconomic History  . Marital status: Married    Spouse name: Not on file  . Number of children: Not on file  . Years of education: Not on file  . Highest education level: Not on file  Occupational History  . Not on file  Tobacco Use  . Smoking status: Never Smoker  . Smokeless tobacco: Never Used  Substance and Sexual Activity  . Alcohol use: No  . Drug use: No  . Sexual activity: Yes  Other Topics Concern  . Not on file  Social History Narrative  . Not on file   Social Determinants of Health   Financial Resource Strain: Not on file  Food Insecurity: Not on file  Transportation Needs: Not on file  Physical Activity: Not on file  Stress: Not on file  Social Connections: Not on file  Intimate Partner Violence: Not on file    Family History  Problem Relation Age of Onset  . Hypertension Mother   . Colon cancer Neg Hx   . Esophageal cancer Neg Hx   . Pancreatic cancer Neg Hx   . Rectal cancer Neg Hx   . Stomach cancer Neg Hx      Review of Systems  Constitutional: Negative.  Negative for chills and fever.  HENT: Negative.  Negative for congestion and sore throat.    Respiratory: Negative.  Negative for cough and shortness of breath.   Cardiovascular: Negative.  Negative for chest pain and palpitations.  Gastrointestinal: Negative.  Negative for abdominal pain, blood in stool, diarrhea, melena, nausea and vomiting.  Genitourinary: Negative.  Negative for dysuria and hematuria.  Musculoskeletal: Negative.  Negative for back pain, myalgias and neck pain.  Skin: Negative.  Negative for rash.  Neurological: Negative.  Negative for dizziness and headaches.  All other systems reviewed and are negative.   Today's Vitals   12/22/20 1506  BP: 138/80  Pulse: 70  Temp: 97.9 F (36.6 C)  TempSrc: Temporal  SpO2: 99%  Weight: 144 lb 12.8 oz (65.7 kg)  Height: 5\' 8"  (1.727 m)   Body mass index is 22.02 kg/m.  Physical Exam Vitals reviewed.  Constitutional:      Appearance: Normal appearance.  HENT:     Head: Normocephalic.  Eyes:     Extraocular Movements: Extraocular movements intact.     Conjunctiva/sclera: Conjunctivae normal.     Pupils: Pupils are equal, round, and reactive to light.  Neck:     Vascular: No carotid bruit.  Cardiovascular:     Rate and Rhythm: Normal rate and regular rhythm.     Pulses:  Normal pulses.     Heart sounds: Normal heart sounds.  Pulmonary:     Effort: Pulmonary effort is normal.     Breath sounds: Normal breath sounds.  Abdominal:     General: Bowel sounds are normal. There is no distension.     Palpations: Abdomen is soft.     Tenderness: There is no abdominal tenderness.  Musculoskeletal:        General: Normal range of motion.     Cervical back: Normal range of motion and neck supple. No tenderness.     Right lower leg: No edema.     Left lower leg: No edema.  Lymphadenopathy:     Cervical: No cervical adenopathy.  Skin:    General: Skin is warm and dry.     Capillary Refill: Capillary refill takes less than 2 seconds.  Neurological:     General: No focal deficit present.     Mental Status: He is  alert and oriented to person, place, and time.  Psychiatric:        Mood and Affect: Mood normal.        Behavior: Behavior normal.      ASSESSMENT & PLAN:  John Mercer was seen today for annual exam.  Diagnoses and all orders for this visit:  Routine general medical examination at a health care facility  Screening for metabolic disorder -     Comprehensive metabolic panel -     Lipid Panel  Need for prophylactic vaccination and inoculation against influenza -     Flu Vaccine QUAD 6+ mos PF IM (Fluarix Quad PF)  Screening for deficiency anemia -     CBC with Differential/Platelet  Screening for lipoid disorders  Screening for endocrine, metabolic and immunity disorder -     Hemoglobin A1c    Patient Instructions   Health Maintenance, Male Adopting a healthy lifestyle and getting preventive care are important in promoting health and wellness. Ask your health care provider about:  The right schedule for you to have regular tests and exams.  Things you can do on your own to prevent diseases and keep yourself healthy. What should I know about diet, weight, and exercise? Eat a healthy diet  Eat a diet that includes plenty of vegetables, fruits, low-fat dairy products, and lean protein.  Do not eat a lot of foods that are high in solid fats, added sugars, or sodium.   Maintain a healthy weight Body mass index (BMI) is a measurement that can be used to identify possible weight problems. It estimates body fat based on height and weight. Your health care provider can help determine your BMI and help you achieve or maintain a healthy weight. Get regular exercise Get regular exercise. This is one of the most important things you can do for your health. Most adults should:  Exercise for at least 150 minutes each week. The exercise should increase your heart rate and make you sweat (moderate-intensity exercise).  Do strengthening exercises at least twice a week. This is in  addition to the moderate-intensity exercise.  Spend less time sitting. Even light physical activity can be beneficial. Watch cholesterol and blood lipids Have your blood tested for lipids and cholesterol at 57 years of age, then have this test every 5 years. You may need to have your cholesterol levels checked more often if:  Your lipid or cholesterol levels are high.  You are older than 57 years of age.  You are at high risk for heart disease.  What should I know about cancer screening? Many types of cancers can be detected early and may often be prevented. Depending on your health history and family history, you may need to have cancer screening at various ages. This may include screening for:  Colorectal cancer.  Prostate cancer.  Skin cancer.  Lung cancer. What should I know about heart disease, diabetes, and high blood pressure? Blood pressure and heart disease  High blood pressure causes heart disease and increases the risk of stroke. This is more likely to develop in people who have high blood pressure readings, are of African descent, or are overweight.  Talk with your health care provider about your target blood pressure readings.  Have your blood pressure checked: ? Every 3-5 years if you are 4918-57 years of age. ? Every year if you are 57 years old or older.  If you are between the ages of 5765 and 3375 and are a current or former smoker, ask your health care provider if you should have a one-time screening for abdominal aortic aneurysm (AAA). Diabetes Have regular diabetes screenings. This checks your fasting blood sugar level. Have the screening done:  Once every three years after age 57 if you are at a normal weight and have a low risk for diabetes.  More often and at a younger age if you are overweight or have a high risk for diabetes. What should I know about preventing infection? Hepatitis B If you have a higher risk for hepatitis B, you should be screened for  this virus. Talk with your health care provider to find out if you are at risk for hepatitis B infection. Hepatitis C Blood testing is recommended for:  Everyone born from [birth year] through 1965.  Anyone with known risk factors for hepatitis C. Sexually transmitted infections (STIs)  You should be screened each year for STIs, including gonorrhea and chlamydia, if: ? You are sexually active and are younger than 57 years of age. ? You are older than 57 years of age and your health care provider tells you that you are at risk for this type of infection. ? Your sexual activity has changed since you were last screened, and you are at increased risk for chlamydia or gonorrhea. Ask your health care provider if you are at risk.  Ask your health care provider about whether you are at high risk for HIV. Your health care provider may recommend a prescription medicine to help prevent HIV infection. If you choose to take medicine to prevent HIV, you should first get tested for HIV. You should then be tested every 3 months for as long as you are taking the medicine. Follow these instructions at home: Lifestyle  Do not use any products that contain nicotine or tobacco, such as cigarettes, e-cigarettes, and chewing tobacco. If you need help quitting, ask your health care provider.  Do not use street drugs.  Do not share needles.  Ask your health care provider for help if you need support or information about quitting drugs. Alcohol use  Do not drink alcohol if your health care provider tells you not to drink.  If you drink alcohol: ? Limit how much you have to 0-2 drinks a day. ? Be aware of how much alcohol is in your drink. In the [place] one drink equals one 12 oz bottle of beer (355 mL), one 5 oz glass of wine (148 mL), or one 1 oz glass of hard liquor (44 mL). General instructions  Schedule regular health,  dental, and eye exams.  Stay current with your vaccines.  Tell your health care provider  if: ? You often feel depressed. ? You have ever been abused or do not feel safe at home. Summary  Adopting a healthy lifestyle and getting preventive care are important in promoting health and wellness.  Follow your health care provider's instructions about healthy diet, exercising, and getting tested or screened for diseases.  Follow your health care provider's instructions on monitoring your cholesterol and blood pressure. This information is not intended to replace advice given to you by your health care provider. Make sure you discuss any questions you have with your health care provider. Document Revised: 10/16/2018 Document Reviewed: 10/16/2018 Elsevier Patient Education  2021 Elsevier Inc.     Edwina Barth, MD [facility]

## 2020-12-23 LAB — CBC WITH DIFFERENTIAL/PLATELET
Basophils Absolute: 0 10*3/uL (ref 0.0–0.2)
Basos: 0 %
EOS (ABSOLUTE): 0 10*3/uL (ref 0.0–0.4)
Eos: 1 %
Hematocrit: 44.7 % (ref 37.5–51.0)
Hemoglobin: 14.9 g/dL (ref 13.0–17.7)
Immature Grans (Abs): 0 10*3/uL (ref 0.0–0.1)
Immature Granulocytes: 1 %
Lymphocytes Absolute: 1.5 10*3/uL (ref 0.7–3.1)
Lymphs: 24 %
MCH: 29 pg (ref 26.6–33.0)
MCHC: 33.3 g/dL (ref 31.5–35.7)
MCV: 87 fL (ref 79–97)
Monocytes Absolute: 0.5 10*3/uL (ref 0.1–0.9)
Monocytes: 9 %
Neutrophils Absolute: 4.1 10*3/uL (ref 1.4–7.0)
Neutrophils: 65 %
Platelets: 219 10*3/uL (ref 150–450)
RBC: 5.14 x10E6/uL (ref 4.14–5.80)
RDW: 13.4 % (ref 11.6–15.4)
WBC: 6.2 10*3/uL (ref 3.4–10.8)

## 2020-12-23 LAB — COMPREHENSIVE METABOLIC PANEL
ALT: 45 IU/L — ABNORMAL HIGH (ref 0–44)
AST: 25 IU/L (ref 0–40)
Albumin/Globulin Ratio: 1.8 (ref 1.2–2.2)
Albumin: 4.6 g/dL (ref 3.8–4.9)
Alkaline Phosphatase: 70 IU/L (ref 44–121)
BUN/Creatinine Ratio: 15 (ref 9–20)
BUN: 17 mg/dL (ref 6–24)
Bilirubin Total: 0.3 mg/dL (ref 0.0–1.2)
CO2: 24 mmol/L (ref 20–29)
Calcium: 9.4 mg/dL (ref 8.7–10.2)
Chloride: 99 mmol/L (ref 96–106)
Creatinine, Ser: 1.17 mg/dL (ref 0.76–1.27)
GFR calc Af Amer: 80 mL/min/{1.73_m2} (ref >59)
GFR calc non Af Amer: 69 mL/min/{1.73_m2} (ref >59)
Globulin, Total: 2.5 g/dL (ref 1.5–4.5)
Glucose: 87 mg/dL (ref 65–99)
Potassium: 4.3 mmol/L (ref 3.5–5.2)
Sodium: 141 mmol/L (ref 134–144)
Total Protein: 7.1 g/dL (ref 6.0–8.5)

## 2020-12-23 LAB — LIPID PANEL
Chol/HDL Ratio: 3.5 ratio (ref 0.0–5.0)
Cholesterol, Total: 182 mg/dL (ref 100–199)
HDL: 52 mg/dL (ref >39)
LDL Chol Calc (NIH): 109 mg/dL — ABNORMAL HIGH (ref 0–99)
Triglycerides: 118 mg/dL (ref 0–149)
VLDL Cholesterol Cal: 21 mg/dL (ref 5–40)

## 2020-12-23 LAB — HEMOGLOBIN A1C
Est. average glucose Bld gHb Est-mCnc: 108 mg/dL
Hgb A1c MFr Bld: 5.4 % (ref 4.8–5.6)

## 2021-06-17 ENCOUNTER — Encounter (HOSPITAL_BASED_OUTPATIENT_CLINIC_OR_DEPARTMENT_OTHER): Payer: Self-pay | Admitting: Emergency Medicine

## 2021-06-17 ENCOUNTER — Emergency Department (HOSPITAL_BASED_OUTPATIENT_CLINIC_OR_DEPARTMENT_OTHER)
Admission: EM | Admit: 2021-06-17 | Discharge: 2021-06-17 | Disposition: A | Discharge status: Home / Self Care | Payer: Managed Care, Other (non HMO) | Attending: Emergency Medicine | Admitting: Emergency Medicine

## 2021-06-17 ENCOUNTER — Emergency Department (HOSPITAL_BASED_OUTPATIENT_CLINIC_OR_DEPARTMENT_OTHER): Payer: Managed Care, Other (non HMO) | Admitting: Radiology

## 2021-06-17 ENCOUNTER — Other Ambulatory Visit: Payer: Self-pay

## 2021-06-17 DIAGNOSIS — M25512 Pain in left shoulder: Secondary | ICD-10-CM | POA: Diagnosis not present

## 2021-06-17 DIAGNOSIS — S4352XA Sprain of left acromioclavicular joint, initial encounter: Secondary | ICD-10-CM | POA: Insufficient documentation

## 2021-06-17 DIAGNOSIS — Y92410 Unspecified street and highway as the place of occurrence of the external cause: Secondary | ICD-10-CM | POA: Insufficient documentation

## 2021-06-17 DIAGNOSIS — S4992XA Unspecified injury of left shoulder and upper arm, initial encounter: Secondary | ICD-10-CM | POA: Diagnosis present
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
## 2021-06-17 NOTE — Discharge Instructions (Addendum)
Rest, ice, sling ROM and strengthening exercises as soon as tolerated Return to sport or work is limited only by pain  Follow-up with sports medicine if symptoms do not improve

## 2021-06-22 ENCOUNTER — Ambulatory Visit: Payer: Self-pay

## 2021-06-22 ENCOUNTER — Other Ambulatory Visit: Payer: Self-pay

## 2021-06-22 ENCOUNTER — Ambulatory Visit (INDEPENDENT_AMBULATORY_CARE_PROVIDER_SITE_OTHER): Payer: Managed Care, Other (non HMO) | Admitting: Sports Medicine

## 2021-06-22 VITALS — Ht 68.0 in | Wt 150.0 lb

## 2021-06-22 DIAGNOSIS — M25512 Pain in left shoulder: Secondary | ICD-10-CM

## 2021-06-22 DIAGNOSIS — S4352XD Sprain of left acromioclavicular joint, subsequent encounter: Secondary | ICD-10-CM

## 2021-06-22 MED ORDER — MELOXICAM 15 MG PO TABS: 15.0000 mg | ORAL_TABLET | Freq: Every day | ORAL | 0 refills | Status: AC

## 2021-06-22 NOTE — Patient Instructions (Signed)
It was great to meeet you today, thank you for letting me participate in your care!  Today, we discussed your left shoulder, indicative of AC-joint sprain. Things to do for this:  - Mobic 15mg  with food once daily for the next 10 days, then as needed from there - Can come out of the sling if pain does not worsen - Begin range of motion and shoulder exercises tomorrow, let pain be your guide (do not do if pain is present)  You will follow-up with me in 2-weeks.  If you have any further questions, please give the clinic a call [phone].  Cheers,  [phone], DO Sports Medicine Fellow [facility]
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
## 2021-06-23 ENCOUNTER — Encounter: Payer: Self-pay | Admitting: Sports Medicine

## 2021-07-06 ENCOUNTER — Ambulatory Visit: Payer: Self-pay

## 2021-07-06 ENCOUNTER — Other Ambulatory Visit: Payer: Self-pay

## 2021-07-06 ENCOUNTER — Ambulatory Visit (INDEPENDENT_AMBULATORY_CARE_PROVIDER_SITE_OTHER): Payer: Managed Care, Other (non HMO) | Admitting: Sports Medicine

## 2021-07-06 VITALS — Ht 68.0 in | Wt 150.0 lb

## 2021-07-06 DIAGNOSIS — M25512 Pain in left shoulder: Secondary | ICD-10-CM

## 2021-07-06 MED ORDER — METHYLPREDNISOLONE ACETATE 40 MG/ML IJ SUSP
40.0000 mg | Freq: Once | INTRAMUSCULAR | Status: AC
  Administered 2021-07-06: 40 mg via INTRA_ARTICULAR
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
## 2021-07-27 ENCOUNTER — Ambulatory Visit (INDEPENDENT_AMBULATORY_CARE_PROVIDER_SITE_OTHER): Payer: Managed Care, Other (non HMO) | Admitting: Sports Medicine

## 2021-07-27 VITALS — Ht 68.0 in | Wt 150.0 lb

## 2021-07-27 DIAGNOSIS — M25512 Pain in left shoulder: Secondary | ICD-10-CM

## 2021-07-27 DIAGNOSIS — M7532 Calcific tendinitis of left shoulder: Secondary | ICD-10-CM | POA: Diagnosis not present
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
## 2021-10-19 ENCOUNTER — Ambulatory Visit: Payer: Managed Care, Other (non HMO) | Admitting: Sports Medicine

## 2021-12-26 ENCOUNTER — Other Ambulatory Visit: Payer: Self-pay

## 2021-12-26 ENCOUNTER — Ambulatory Visit (INDEPENDENT_AMBULATORY_CARE_PROVIDER_SITE_OTHER): Payer: Managed Care, Other (non HMO) | Admitting: Emergency Medicine

## 2021-12-26 ENCOUNTER — Encounter: Payer: Self-pay | Admitting: Emergency Medicine

## 2021-12-26 VITALS — BP 116/82 | HR 61 | Ht 68.0 in | Wt 151.0 lb

## 2021-12-26 DIAGNOSIS — Z13228 Encounter for screening for other metabolic disorders: Secondary | ICD-10-CM | POA: Diagnosis not present

## 2021-12-26 DIAGNOSIS — Z13220 Encounter for screening for lipoid disorders: Secondary | ICD-10-CM | POA: Diagnosis not present

## 2021-12-26 DIAGNOSIS — Z130 Encounter for screening for diseases of the blood and blood-forming organs and certain disorders involving the immune mechanism: Secondary | ICD-10-CM | POA: Diagnosis not present

## 2021-12-26 DIAGNOSIS — Z0000 Encounter for general adult medical examination without abnormal findings: Secondary | ICD-10-CM

## 2021-12-26 DIAGNOSIS — Z8619 Personal history of other infectious and parasitic diseases: Secondary | ICD-10-CM | POA: Diagnosis not present

## 2021-12-26 DIAGNOSIS — Z1329 Encounter for screening for other suspected endocrine disorder: Secondary | ICD-10-CM | POA: Diagnosis not present

## 2021-12-26 LAB — CBC WITH DIFFERENTIAL/PLATELET
Basophils Absolute: 0 10*3/uL (ref 0.0–0.1)
Basophils Relative: 0.2 % (ref 0.0–3.0)
Eosinophils Absolute: 0 10*3/uL (ref 0.0–0.7)
Eosinophils Relative: 0.4 % (ref 0.0–5.0)
HCT: 45.2 % (ref 39.0–52.0)
Hemoglobin: 15 g/dL (ref 13.0–17.0)
Lymphocytes Relative: 24.1 % (ref 12.0–46.0)
Lymphs Abs: 1.3 10*3/uL (ref 0.7–4.0)
MCHC: 33.2 g/dL (ref 30.0–36.0)
MCV: 87.9 fl (ref 78.0–100.0)
Monocytes Absolute: 0.3 10*3/uL (ref 0.1–1.0)
Monocytes Relative: 6.3 % (ref 3.0–12.0)
Neutro Abs: 3.7 10*3/uL (ref 1.4–7.7)
Neutrophils Relative %: 69 % (ref 43.0–77.0)
Platelets: 178 10*3/uL (ref 150.0–400.0)
RBC: 5.14 Mil/uL (ref 4.22–5.81)
RDW: 14.5 % (ref 11.5–15.5)
WBC: 5.3 10*3/uL (ref 4.0–10.5)

## 2021-12-26 LAB — COMPREHENSIVE METABOLIC PANEL
ALT: 12 U/L (ref 0–53)
AST: 15 U/L (ref 0–37)
Albumin: 4.6 g/dL (ref 3.5–5.2)
Alkaline Phosphatase: 56 U/L (ref 39–117)
BUN: 9 mg/dL (ref 6–23)
CO2: 35 mEq/L — ABNORMAL HIGH (ref 19–32)
Calcium: 9.8 mg/dL (ref 8.4–10.5)
Chloride: 103 mEq/L (ref 96–112)
Creatinine, Ser: 1.22 mg/dL (ref 0.40–1.50)
GFR: 65.59 mL/min (ref >60.00)
Glucose, Bld: 95 mg/dL (ref 70–99)
Potassium: 4.3 mEq/L (ref 3.5–5.1)
Sodium: 140 mEq/L (ref 135–145)
Total Bilirubin: 0.7 mg/dL (ref 0.2–1.2)
Total Protein: 7.3 g/dL (ref 6.0–8.3)

## 2021-12-26 LAB — LIPID PANEL
Cholesterol: 189 mg/dL (ref 0–200)
HDL: 53.5 mg/dL (ref >39.00)
LDL Cholesterol: 124 mg/dL — ABNORMAL HIGH (ref 0–99)
NonHDL: 135.73
Total CHOL/HDL Ratio: 4
Triglycerides: 57 mg/dL (ref 0.0–149.0)
VLDL: 11.4 mg/dL (ref 0.0–40.0)

## 2021-12-26 MED ORDER — VALACYCLOVIR HCL 1 G PO TABS: 1000.0000 mg | ORAL_TABLET | Freq: Two times a day (BID) | ORAL | 11 refills | Status: AC

## 2021-12-26 NOTE — Progress Notes (Signed)
John Mercer 56 y.o.   Chief Complaint  Patient presents with   Annual Exam    HISTORY OF PRESENT ILLNESS: This is a 56 y.o. male here for annual exam. Healthy male with a healthy lifestyle. Has no complaints or medical concerns today.  HPI   Prior to Admission medications   Medication Sig Start Date End Date Taking? Authorizing Provider  cyclobenzaprine (FLEXERIL) 10 MG tablet Take by mouth. 06/14/21  Yes [provider]  valACYclovir (VALTREX) 1000 MG tablet Take 1 tablet (1,000 mg total) by mouth 2 (two) times daily for 10 days. 12/26/21 01/05/22 Yes SagardiaInes Bloomer, MD    No Known Allergies  Patient Active Problem List   Diagnosis Date Noted   HSV infection 08/29/2017    Past Medical History:  Diagnosis Date   Hematochezia 2010   Prostatitis     Past Surgical History:  Procedure Laterality Date   CERVICAL SPINE SURGERY  1994   VASECTOMY      Social History   Socioeconomic History   Marital status: Married    Spouse name: Not on file   Number of children: Not on file   Years of education: Not on file   Highest education level: Not on file  Occupational History   Not on file  Tobacco Use   Smoking status: Never   Smokeless tobacco: Never  Substance and Sexual Activity   Alcohol use: No   Drug use: No   Sexual activity: Yes  Other Topics Concern   Not on file  Social History Narrative   Not on file   Social Determinants of Health   Financial Resource Strain: Not on file  Food Insecurity: Not on file  Transportation Needs: Not on file  Physical Activity: Not on file  Stress: Not on file  Social Connections: Not on file  Intimate Partner Violence: Not on file    Family History  Problem Relation Age of Onset   Hypertension Mother    Colon cancer Neg Hx    Esophageal cancer Neg Hx    Pancreatic cancer Neg Hx    Rectal cancer Neg Hx    Stomach cancer Neg Hx      Review of Systems  Constitutional: Negative.  Negative for  chills and fever.  HENT: Negative.  Negative for congestion and sore throat.   Respiratory: Negative.  Negative for cough and shortness of breath.   Cardiovascular: Negative.  Negative for chest pain and palpitations.  Gastrointestinal: Negative.  Negative for abdominal pain, diarrhea, nausea and vomiting.  Genitourinary: Negative.  Negative for dysuria and hematuria.  Skin: Negative.  Negative for rash.  Neurological: Negative.  Negative for dizziness and headaches.  All other systems reviewed and are negative.  Today's Vitals   12/26/21 1542  BP: 116/82  Pulse: 61  SpO2: 95%  Weight: 151 lb (68.5 kg)  Height: 5\' 8"  (1.727 m)   Body mass index is 22.96 kg/m.  Physical Exam Vitals reviewed.  Constitutional:      Appearance: Normal appearance.  HENT:     Head: Normocephalic.     Right Ear: Tympanic membrane, ear canal and external ear normal.     Left Ear: Tympanic membrane, ear canal and external ear normal.     Mouth/Throat:     Mouth: Mucous membranes are moist.     Pharynx: Oropharynx is clear.  Eyes:     Extraocular Movements: Extraocular movements intact.     Conjunctiva/sclera: Conjunctivae normal.  Pupils: Pupils are equal, round, and reactive to light.  Cardiovascular:     Rate and Rhythm: Normal rate and regular rhythm.     Pulses: Normal pulses.     Heart sounds: Normal heart sounds.  Pulmonary:     Effort: Pulmonary effort is normal.     Breath sounds: Normal breath sounds.  Abdominal:     General: Bowel sounds are normal. There is no distension.     Palpations: Abdomen is soft.     Tenderness: There is no abdominal tenderness.  Musculoskeletal:        General: Normal range of motion.     Cervical back: Normal range of motion and neck supple.     Right lower leg: No edema.     Left lower leg: No edema.  Skin:    General: Skin is warm and dry.  Neurological:     General: No focal deficit present.     Mental Status: He is alert and oriented to  person, place, and time.  Psychiatric:        Mood and Affect: Mood normal.        Behavior: Behavior normal.     ASSESSMENT & PLAN: Problem List Items Addressed This Visit   None Visit Diagnoses     Routine general medical examination at a health care facility    -  Primary   History of herpes simplex infection       Relevant Medications   valACYclovir (VALTREX) 1000 MG tablet   Screening for deficiency anemia       Relevant Orders   CBC with Differential   Screening for lipoid disorders       Relevant Orders   Lipid panel   Screening for endocrine, metabolic and immunity disorder       Relevant Orders   Comprehensive metabolic panel   Hemoglobin A1c      Modifiable risk factors discussed with patient. Anticipatory guidance according to age provided. The following topics were also discussed: Social Determinants of Health Smoking.  Non-smoker Diet and nutrition Benefits of exercise Cancer screening and review of colonoscopy report from 2019 Vaccinations recommendations Cardiovascular risk assessment Mental health including depression and anxiety Fall and accident prevention  Patient Instructions  Health Maintenance, Male Adopting a healthy lifestyle and getting preventive care are important in promoting health and wellness. Ask your health care provider about: The right schedule for you to have regular tests and exams. Things you can do on your own to prevent diseases and keep yourself healthy. What should I know about diet, weight, and exercise? Eat a healthy diet  Eat a diet that includes plenty of vegetables, fruits, low-fat dairy products, and lean protein. Do not eat a lot of foods that are high in solid fats, added sugars, or sodium. Maintain a healthy weight Body mass index (BMI) is a measurement that can be used to identify possible weight problems. It estimates body fat based on height and weight. Your health care provider can help determine your BMI and  help you achieve or maintain a healthy weight. Get regular exercise Get regular exercise. This is one of the most important things you can do for your health. Most adults should: Exercise for at least 150 minutes each week. The exercise should increase your heart rate and make you sweat (moderate-intensity exercise). Do strengthening exercises at least twice a week. This is in addition to the moderate-intensity exercise. Spend less time sitting. Even light physical activity can be beneficial.  Watch cholesterol and blood lipids Have your blood tested for lipids and cholesterol at 58 years of age, then have this test every 5 years. You may need to have your cholesterol levels checked more often if: Your lipid or cholesterol levels are high. You are older than 58 years of age. You are at high risk for heart disease. What should I know about cancer screening? Many types of cancers can be detected early and may often be prevented. Depending on your health history and family history, you may need to have cancer screening at various ages. This may include screening for: Colorectal cancer. Prostate cancer. Skin cancer. Lung cancer. What should I know about heart disease, diabetes, and high blood pressure? Blood pressure and heart disease High blood pressure causes heart disease and increases the risk of stroke. This is more likely to develop in people who have high blood pressure readings or are overweight. Talk with your health care provider about your target blood pressure readings. Have your blood pressure checked: Every 3-5 years if you are 45-81 years of age. Every year if you are 21 years old or older. If you are between the ages of 26 and 66 and are a current or former smoker, ask your health care provider if you should have a one-time screening for abdominal aortic aneurysm (AAA). Diabetes Have regular diabetes screenings. This checks your fasting blood sugar level. Have the screening  done: Once every three years after age 30 if you are at a normal weight and have a low risk for diabetes. More often and at a younger age if you are overweight or have a high risk for diabetes. What should I know about preventing infection? Hepatitis B If you have a higher risk for hepatitis B, you should be screened for this virus. Talk with your health care provider to find out if you are at risk for hepatitis B infection. Hepatitis C Blood testing is recommended for: Everyone born from [birth year] through 1967. Anyone with known risk factors for hepatitis C. Sexually transmitted infections (STIs) You should be screened each year for STIs, including gonorrhea and chlamydia, if: You are sexually active and are younger than 58 years of age. You are older than 58 years of age and your health care provider tells you that you are at risk for this type of infection. Your sexual activity has changed since you were last screened, and you are at increased risk for chlamydia or gonorrhea. Ask your health care provider if you are at risk. Ask your health care provider about whether you are at high risk for HIV. Your health care provider may recommend a prescription medicine to help prevent HIV infection. If you choose to take medicine to prevent HIV, you should first get tested for HIV. You should then be tested every 3 months for as long as you are taking the medicine. Follow these instructions at home: Alcohol use Do not drink alcohol if your health care provider tells you not to drink. If you drink alcohol: Limit how much you have to 0-2 drinks a day. Know how much alcohol is in your drink. In the [place] one drink equals one 12 oz bottle of beer (355 mL), one 5 oz glass of wine (148 mL), or one 1 oz glass of hard liquor (44 mL). Lifestyle Do not use any products that contain nicotine or tobacco. These products include cigarettes, chewing tobacco, and vaping devices, such as e-cigarettes. If you need help  quitting, ask your health  care provider. Do not use street drugs. Do not share needles. Ask your health care provider for help if you need support or information about quitting drugs. General instructions Schedule regular health, dental, and eye exams. Stay current with your vaccines. Tell your health care provider if: You often feel depressed. You have ever been abused or do not feel safe at home. Summary Adopting a healthy lifestyle and getting preventive care are important in promoting health and wellness. Follow your health care provider's instructions about healthy diet, exercising, and getting tested or screened for diseases. Follow your health care provider's instructions on monitoring your cholesterol and blood pressure. This information is not intended to replace advice given to you by your health care provider. Make sure you discuss any questions you have with your health care provider. Document Revised: 03/14/2021 Document Reviewed: 03/14/2021 Elsevier Patient Education  2022 [place], [place] [facility]

## 2021-12-26 NOTE — Patient Instructions (Signed)

## 2021-12-27 LAB — HEMOGLOBIN A1C: Hgb A1c MFr Bld: 5.6 % (ref 4.6–6.5)
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
## 2024-09-23 ENCOUNTER — Other Ambulatory Visit: Payer: Self-pay

## 2024-09-23 ENCOUNTER — Encounter: Payer: Self-pay | Admitting: Emergency Medicine

## 2024-09-23 ENCOUNTER — Ambulatory Visit (INDEPENDENT_AMBULATORY_CARE_PROVIDER_SITE_OTHER): Admitting: Emergency Medicine

## 2024-09-23 ENCOUNTER — Ambulatory Visit: Payer: Self-pay | Admitting: Emergency Medicine

## 2024-09-23 VITALS — BP 126/80 | HR 57 | Temp 97.9°F | Ht 68.0 in | Wt 150.0 lb

## 2024-09-23 DIAGNOSIS — Z130 Encounter for screening for diseases of the blood and blood-forming organs and certain disorders involving the immune mechanism: Secondary | ICD-10-CM

## 2024-09-23 DIAGNOSIS — B009 Herpesviral infection, unspecified: Secondary | ICD-10-CM

## 2024-09-23 DIAGNOSIS — Z1329 Encounter for screening for other suspected endocrine disorder: Secondary | ICD-10-CM

## 2024-09-23 DIAGNOSIS — Z125 Encounter for screening for malignant neoplasm of prostate: Secondary | ICD-10-CM

## 2024-09-23 DIAGNOSIS — Z13228 Encounter for screening for other metabolic disorders: Secondary | ICD-10-CM | POA: Diagnosis not present

## 2024-09-23 DIAGNOSIS — Z23 Encounter for immunization: Secondary | ICD-10-CM | POA: Diagnosis not present

## 2024-09-23 DIAGNOSIS — Z0000 Encounter for general adult medical examination without abnormal findings: Secondary | ICD-10-CM

## 2024-09-23 DIAGNOSIS — Z13220 Encounter for screening for lipoid disorders: Secondary | ICD-10-CM

## 2024-09-23 LAB — CBC WITH DIFFERENTIAL/PLATELET
Basophils Absolute: 0 K/uL (ref 0.0–0.1)
Basophils Relative: 0.2 % (ref 0.0–3.0)
Eosinophils Absolute: 0 K/uL (ref 0.0–0.7)
Eosinophils Relative: 0.8 % (ref 0.0–5.0)
HCT: 43.5 % (ref 39.0–52.0)
Hemoglobin: 14.7 g/dL (ref 13.0–17.0)
Lymphocytes Relative: 26.7 % (ref 12.0–46.0)
Lymphs Abs: 1.1 K/uL (ref 0.7–4.0)
MCHC: 33.7 g/dL (ref 30.0–36.0)
MCV: 88.7 fl (ref 78.0–100.0)
Monocytes Absolute: 0.4 K/uL (ref 0.1–1.0)
Monocytes Relative: 8.5 % (ref 3.0–12.0)
Neutro Abs: 2.7 K/uL (ref 1.4–7.7)
Neutrophils Relative %: 63.8 % (ref 43.0–77.0)
Platelets: 173 K/uL (ref 150.0–400.0)
RBC: 4.91 Mil/uL (ref 4.22–5.81)
RDW: 14.7 % (ref 11.5–15.5)
WBC: 4.2 K/uL (ref 4.0–10.5)

## 2024-09-23 LAB — COMPREHENSIVE METABOLIC PANEL WITH GFR
ALT: 10 U/L (ref 0–53)
AST: 13 U/L (ref 0–37)
Albumin: 4.5 g/dL (ref 3.5–5.2)
Alkaline Phosphatase: 53 U/L (ref 39–117)
BUN: 9 mg/dL (ref 6–23)
CO2: 30 meq/L (ref 19–32)
Calcium: 9.4 mg/dL (ref 8.4–10.5)
Chloride: 103 meq/L (ref 96–112)
Creatinine, Ser: 1.26 mg/dL (ref 0.40–1.50)
GFR: 61.89 mL/min (ref >60.00)
Glucose, Bld: 83 mg/dL (ref 70–99)
Potassium: 4.9 meq/L (ref 3.5–5.1)
Sodium: 140 meq/L (ref 135–145)
Total Bilirubin: 0.6 mg/dL (ref 0.2–1.2)
Total Protein: 6.8 g/dL (ref 6.0–8.3)

## 2024-09-23 LAB — LIPID PANEL
Cholesterol: 181 mg/dL (ref 0–200)
HDL: 49.4 mg/dL (ref >39.00)
LDL Cholesterol: 117 mg/dL — ABNORMAL HIGH (ref 0–99)
NonHDL: 131.49
Total CHOL/HDL Ratio: 4
Triglycerides: 72 mg/dL (ref 0.0–149.0)
VLDL: 14.4 mg/dL (ref 0.0–40.0)

## 2024-09-23 LAB — PSA: PSA: 1.98 ng/mL (ref 0.10–4.00)

## 2024-09-23 LAB — HEMOGLOBIN A1C: Hgb A1c MFr Bld: 5.6 % (ref 4.6–6.5)

## 2024-09-23 MED ORDER — VALACYCLOVIR HCL 1 G PO TABS: 1000.0000 mg | ORAL_TABLET | Freq: Two times a day (BID) | ORAL | 3 refills | Status: AC

## 2024-09-23 NOTE — Progress Notes (Signed)
 John Mercer 58 y.o.   Chief Complaint  Patient presents with   Annual Exam    HISTORY OF PRESENT ILLNESS: This is a 58 y.o. male here for annual exam. Overall doing well.  Has no complaints or medical concerns today.  HPI   Prior to Admission medications   Medication Sig Start Date End Date Taking? Authorizing Provider  cyclobenzaprine (FLEXERIL) 10 MG tablet Take by mouth. 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    Past Medical History:  Diagnosis Date   Hematochezia 2010   Prostatitis     Past Surgical History:  Procedure Laterality Date   CERVICAL SPINE SURGERY  1994   VASECTOMY      Social History   Socioeconomic History   Marital status: Married    Spouse name: Not on file   Number of children: Not on file   Years of education: Not on file   Highest education level: Master's degree (e.g., MA, MS, MEng, MEd, MSW, MBA)  Occupational History   Not on file  Tobacco Use   Smoking status: Never   Smokeless tobacco: Never  Substance and Sexual Activity   Alcohol use: No   Drug use: No   Sexual activity: Yes  Other Topics Concern   Not on file  Social History Narrative   Not on file   Social Drivers of Health   Financial Resource Strain: Low Risk  (09/17/2024)   Overall Financial Resource Strain (CARDIA)    Difficulty of Paying Living Expenses: Not hard at all  Food Insecurity: No Food Insecurity (09/17/2024)   Hunger Vital Sign    Worried About Running Out of Food in the Last Year: Never true    Ran Out of Food in the Last Year: Never true  Transportation Needs: No Transportation Needs (09/17/2024)   PRAPARE - Administrator, Civil Service (Medical): No    Lack of Transportation (Non-Medical): No  Physical Activity: Inactive (09/17/2024)   Exercise Vital Sign    Days of Exercise per Week: 0 days    Minutes of Exercise per Session:  Not on file  Stress: No Stress Concern Present (09/17/2024)   [employer] of Occupational Health - Occupational Stress Questionnaire    Feeling of Stress: Not at all  Social Connections: Socially Integrated (09/17/2024)   Social Connection and Isolation Panel    Frequency of Communication with Friends and Family: More than three times a week    Frequency of Social Gatherings with Friends and Family: More than three times a week    Attends Religious Services: More than 4 times per year    Active Member of [employer] or Organizations: Yes    Attends Engineer, Structural: More than 4 times per year    Marital Status: Married  Catering Manager Violence: Not on file    Family History  Problem Relation Age of Onset   Hypertension Mother    Colon cancer Neg Hx    Esophageal cancer Neg Hx    Pancreatic cancer Neg Hx    Rectal cancer Neg Hx    Stomach cancer Neg Hx      Review of Systems  Constitutional: Negative.  Negative for chills and fever.  HENT: Negative.  Negative for congestion and sore throat.   Respiratory: Negative.  Negative for cough and shortness of breath.   Cardiovascular: Negative.  Negative for chest pain and palpitations.  Gastrointestinal:  Negative for abdominal pain, diarrhea, nausea and vomiting.  Genitourinary: Negative.  Negative for dysuria and hematuria.  Skin: Negative.  Negative for rash.  Neurological: Negative.  Negative for dizziness and headaches.  All other systems reviewed and are negative.   Vitals:   09/23/24 0941  BP: 126/80  Pulse: (!) 57  Temp: 97.9 F (36.6 C)  SpO2: 99%    Physical Exam Vitals reviewed.  Constitutional:      Appearance: Normal appearance.  HENT:     Head: Normocephalic.     Right Ear: Tympanic membrane, ear canal and external ear normal.     Left Ear: Tympanic membrane, ear canal and external ear normal.     Mouth/Throat:     Mouth: Mucous membranes are moist.     Pharynx: Oropharynx is clear.   Eyes:     Extraocular Movements: Extraocular movements intact.     Conjunctiva/sclera: Conjunctivae normal.     Pupils: Pupils are equal, round, and reactive to light.  Cardiovascular:     Rate and Rhythm: Normal rate and regular rhythm.     Pulses: Normal pulses.     Heart sounds: Normal heart sounds.  Pulmonary:     Effort: Pulmonary effort is normal.     Breath sounds: Normal breath sounds.  Abdominal:     Palpations: Abdomen is soft.     Tenderness: There is no abdominal tenderness.  Musculoskeletal:     Cervical back: No tenderness.  Lymphadenopathy:     Cervical: No cervical adenopathy.  Skin:    General: Skin is warm and dry.     Capillary Refill: Capillary refill takes less than 2 seconds.  Neurological:     General: No focal deficit present.     Mental Status: He is alert and oriented to person, place, and time.  Psychiatric:        Mood and Affect: Mood normal.        Behavior: Behavior normal.      ASSESSMENT & PLAN: Problem List Items Addressed This Visit   None Visit Diagnoses       Routine general medical examination at a health care facility    -  Primary   Relevant Orders   CBC with Differential/Platelet   Comprehensive metabolic panel with GFR   Hemoglobin A1c   Lipid panel   PSA     Flu vaccine need       Relevant Orders   Flu vaccine trivalent PF, 6mos and older(Flulaval,Afluria,Fluarix,Fluzone)     Screening for deficiency anemia       Relevant Orders   CBC with Differential/Platelet     Screening for lipoid disorders       Relevant Orders   Lipid panel     Screening for endocrine, metabolic and immunity disorder       Relevant Orders   Comprehensive metabolic panel with GFR   Hemoglobin A1c     Screening for prostate cancer       Relevant Orders   PSA      Modifiable risk factors discussed with patient. Anticipatory guidance according to age provided. The following topics were also discussed: Social Determinants of  Health Smoking.  Non-smoker Diet and nutrition Benefits of exercise Cancer screening and review of colonoscopy report from 2019 Vaccinations review and recommendations Cardiovascular risk assessment and need for blood work today The 10-year ASCVD risk score (Arnett DK, et al., 2019) is: 7.7%   Values  used to calculate the score:     Age: 30 years     Clincally relevant sex: Male     Is Non-Hispanic African American: Yes     Diabetic: No     Tobacco smoker: No     Systolic Blood Pressure: 126 mmHg     Is BP treated: No     HDL Cholesterol: 53.5 mg/dL     Total Cholesterol: 189 mg/dL  Mental health including depression and anxiety Fall and accident prevention  Patient Instructions  Health Maintenance, Male Adopting a healthy lifestyle and getting preventive care are important in promoting health and wellness. Ask your health care provider about: The right schedule for you to have regular tests and exams. Things you can do on your own to prevent diseases and keep yourself healthy. What should I know about diet, weight, and exercise? Eat a healthy diet  Eat a diet that includes plenty of vegetables, fruits, low-fat dairy products, and lean protein. Do not eat a lot of foods that are high in solid fats, added sugars, or sodium. Maintain a healthy weight Body mass index (BMI) is a measurement that can be used to identify possible weight problems. It estimates body fat based on height and weight. Your health care provider can help determine your BMI and help you achieve or maintain a healthy weight. Get regular exercise Get regular exercise. This is one of the most important things you can do for your health. Most adults should: Exercise for at least 150 minutes each week. The exercise should increase your heart rate and make you sweat (moderate-intensity exercise). Do strengthening exercises at least twice a week. This is in addition to the moderate-intensity exercise. Spend less time  sitting. Even light physical activity can be beneficial. Watch cholesterol and blood lipids Have your blood tested for lipids and cholesterol at 60 years of age, then have this test every 5 years. You may need to have your cholesterol levels checked more often if: Your lipid or cholesterol levels are high. You are older than 60 years of age. You are at high risk for heart disease. What should I know about cancer screening? Many types of cancers can be detected early and may often be prevented. Depending on your health history and family history, you may need to have cancer screening at various ages. This may include screening for: Colorectal cancer. Prostate cancer. Skin cancer. Lung cancer. What should I know about heart disease, diabetes, and high blood pressure? Blood pressure and heart disease High blood pressure causes heart disease and increases the risk of stroke. This is more likely to develop in people who have high blood pressure readings or are overweight. Talk with your health care provider about your target blood pressure readings. Have your blood pressure checked: Every 3-5 years if you are 20-61 years of age. Every year if you are 39 years old or older. If you are between the ages of 81 and 59 and are a current or former smoker, ask your health care provider if you should have a one-time screening for abdominal aortic aneurysm (AAA). Diabetes Have regular diabetes screenings. This checks your fasting blood sugar level. Have the screening done: Once every three years after age 40 if you are at a normal weight and have a low risk for diabetes. More often and at a younger age if you are overweight or have a high risk for diabetes. What should I know about preventing infection? Hepatitis B If you have a  higher risk for hepatitis B, you should be screened for this virus. Talk with your health care provider to find out if you are at risk for hepatitis B infection. Hepatitis  C Blood testing is recommended for: Everyone born from [birth year] through 1967. Anyone with known risk factors for hepatitis C. Sexually transmitted infections (STIs) You should be screened each year for STIs, including gonorrhea and chlamydia, if: You are sexually active and are younger than 60 years of age. You are older than 60 years of age and your health care provider tells you that you are at risk for this type of infection. Your sexual activity has changed since you were last screened, and you are at increased risk for chlamydia or gonorrhea. Ask your health care provider if you are at risk. Ask your health care provider about whether you are at high risk for HIV. Your health care provider may recommend a prescription medicine to help prevent HIV infection. If you choose to take medicine to prevent HIV, you should first get tested for HIV. You should then be tested every 3 months for as long as you are taking the medicine. Follow these instructions at home: Alcohol use Do not drink alcohol if your health care provider tells you not to drink. If you drink alcohol: Limit how much you have to 0-2 drinks a day. Know how much alcohol is in your drink. In the [place] one drink equals one 12 oz bottle of beer (355 mL), one 5 oz glass of wine (148 mL), or one 1 oz glass of hard liquor (44 mL). Lifestyle Do not use any products that contain nicotine or tobacco. These products include cigarettes, chewing tobacco, and vaping devices, such as e-cigarettes. If you need help quitting, ask your health care provider. Do not use street drugs. Do not share needles. Ask your health care provider for help if you need support or information about quitting drugs. General instructions Schedule regular health, dental, and eye exams. Stay current with your vaccines. Tell your health care provider if: You often feel depressed. You have ever been abused or do not feel safe at home. Summary Adopting a healthy  lifestyle and getting preventive care are important in promoting health and wellness. Follow your health care provider's instructions about healthy diet, exercising, and getting tested or screened for diseases. Follow your health care provider's instructions on monitoring your cholesterol and blood pressure. This information is not intended to replace advice given to you by your health care provider. Make sure you discuss any questions you have with your health care provider. Document Revised: 03/14/2021 Document Reviewed: 03/14/2021 Elsevier Patient Education  2024 Elsevier Inc.      Emil Schaumann, MD [facility]

## 2024-09-23 NOTE — Patient Instructions (Signed)
 Health Maintenance, Male  Adopting a healthy lifestyle and getting preventive care are important in promoting health and wellness. Ask your health care provider about:  The right schedule for you to have regular tests and exams.  Things you can do on your own to prevent diseases and keep yourself healthy.  What should I know about diet, weight, and exercise?  Eat a healthy diet    Eat a diet that includes plenty of vegetables, fruits, low-fat dairy products, and lean protein.  Do not eat a lot of foods that are high in solid fats, added sugars, or sodium.  Maintain a healthy weight  Body mass index (BMI) is a measurement that can be used to identify possible weight problems. It estimates body fat based on height and weight. Your health care provider can help determine your BMI and help you achieve or maintain a healthy weight.  Get regular exercise  Get regular exercise. This is one of the most important things you can do for your health. Most adults should:  Exercise for at least 150 minutes each week. The exercise should increase your heart rate and make you sweat (moderate-intensity exercise).  Do strengthening exercises at least twice a week. This is in addition to the moderate-intensity exercise.  Spend less time sitting. Even light physical activity can be beneficial.  Watch cholesterol and blood lipids  Have your blood tested for lipids and cholesterol at 60 years of age, then have this test every 5 years.  You may need to have your cholesterol levels checked more often if:  Your lipid or cholesterol levels are high.  You are older than 60 years of age.  You are at high risk for heart disease.  What should I know about cancer screening?  Many types of cancers can be detected early and may often be prevented. Depending on your health history and family history, you may need to have cancer screening at various ages. This may include screening for:  Colorectal cancer.  Prostate cancer.  Skin cancer.  Lung  cancer.  What should I know about heart disease, diabetes, and high blood pressure?  Blood pressure and heart disease  High blood pressure causes heart disease and increases the risk of stroke. This is more likely to develop in people who have high blood pressure readings or are overweight.  Talk with your health care provider about your target blood pressure readings.  Have your blood pressure checked:  Every 3-5 years if you are 24-52 years of age.  Every year if you are 3 years old or older.  If you are between the ages of 60 and 72 and are a current or former smoker, ask your health care provider if you should have a one-time screening for abdominal aortic aneurysm (AAA).  Diabetes  Have regular diabetes screenings. This checks your fasting blood sugar level. Have the screening done:  Once every three years after age 66 if you are at a normal weight and have a low risk for diabetes.  More often and at a younger age if you are overweight or have a high risk for diabetes.  What should I know about preventing infection?  Hepatitis B  If you have a higher risk for hepatitis B, you should be screened for this virus. Talk with your health care provider to find out if you are at risk for hepatitis B infection.  Hepatitis C  Blood testing is recommended for:  Everyone born from [birth year] through 1967.  Anyone  with known risk factors for hepatitis C.  Sexually transmitted infections (STIs)  You should be screened each year for STIs, including gonorrhea and chlamydia, if:  You are sexually active and are younger than 60 years of age.  You are older than 60 years of age and your health care provider tells you that you are at risk for this type of infection.  Your sexual activity has changed since you were last screened, and you are at increased risk for chlamydia or gonorrhea. Ask your health care provider if you are at risk.  Ask your health care provider about whether you are at high risk for HIV. Your health care provider  may recommend a prescription medicine to help prevent HIV infection. If you choose to take medicine to prevent HIV, you should first get tested for HIV. You should then be tested every 3 months for as long as you are taking the medicine.  Follow these instructions at home:  Alcohol use  Do not drink alcohol if your health care provider tells you not to drink.  If you drink alcohol:  Limit how much you have to 0-2 drinks a day.  Know how much alcohol is in your drink. In the [place] one drink equals one 12 oz bottle of beer (355 mL), one 5 oz glass of wine (148 mL), or one 1 oz glass of hard liquor (44 mL).  Lifestyle  Do not use any products that contain nicotine or tobacco. These products include cigarettes, chewing tobacco, and vaping devices, such as e-cigarettes. If you need help quitting, ask your health care provider.  Do not use street drugs.  Do not share needles.  Ask your health care provider for help if you need support or information about quitting drugs.  General instructions  Schedule regular health, dental, and eye exams.  Stay current with your vaccines.  Tell your health care provider if:  You often feel depressed.  You have ever been abused or do not feel safe at home.  Summary  Adopting a healthy lifestyle and getting preventive care are important in promoting health and wellness.  Follow your health care provider's instructions about healthy diet, exercising, and getting tested or screened for diseases.  Follow your health care provider's instructions on monitoring your cholesterol and blood pressure.  This information is not intended to replace advice given to you by your health care provider. Make sure you discuss any questions you have with your health care provider.  Document Revised: 03/14/2021 Document Reviewed: 03/14/2021  Elsevier Patient Education  2024 [employer].
